# Patient Record
Sex: Female | Born: 1940 | Race: White | Hispanic: No | Smoking: Former smoker
Health system: Southern US, Community
[De-identification: ages and names within clinical notes are randomized; demographics above are authoritative.]

## PROBLEM LIST (undated history)

## (undated) DIAGNOSIS — I10 Essential (primary) hypertension: Secondary | ICD-10-CM

## (undated) HISTORY — DX: Essential (primary) hypertension: I10

## (undated) HISTORY — PX: CHOLECYSTECTOMY: SHX55

---

## 1998-10-09 ENCOUNTER — Encounter: Payer: Self-pay | Admitting: Family Medicine

## 1998-10-09 ENCOUNTER — Ambulatory Visit (HOSPITAL_COMMUNITY): Admission: RE | Admit: 1998-10-09 | Discharge: 1998-10-09 | Payer: Self-pay | Admitting: Family Medicine

## 1999-01-11 ENCOUNTER — Other Ambulatory Visit: Admission: RE | Admit: 1999-01-11 | Discharge: 1999-01-11 | Payer: Self-pay | Admitting: Family Medicine

## 1999-02-13 ENCOUNTER — Ambulatory Visit (HOSPITAL_COMMUNITY): Admission: RE | Admit: 1999-02-13 | Discharge: 1999-02-13 | Payer: Self-pay | Admitting: Gastroenterology

## 2001-04-02 ENCOUNTER — Other Ambulatory Visit: Admission: RE | Admit: 2001-04-02 | Discharge: 2001-04-02 | Payer: Self-pay | Admitting: Family Medicine

## 2002-02-22 ENCOUNTER — Encounter (INDEPENDENT_AMBULATORY_CARE_PROVIDER_SITE_OTHER): Payer: Self-pay | Admitting: *Deleted

## 2002-02-22 ENCOUNTER — Inpatient Hospital Stay (HOSPITAL_COMMUNITY): Admission: EM | Admit: 2002-02-22 | Discharge: 2002-02-24 | Payer: Self-pay | Admitting: Emergency Medicine

## 2002-02-22 ENCOUNTER — Encounter: Payer: Self-pay | Admitting: General Surgery

## 2002-02-23 ENCOUNTER — Encounter: Payer: Self-pay | Admitting: General Surgery

## 2002-04-05 ENCOUNTER — Other Ambulatory Visit: Admission: RE | Admit: 2002-04-05 | Discharge: 2002-04-05 | Payer: Self-pay | Admitting: Family Medicine

## 2004-01-11 ENCOUNTER — Encounter: Admission: RE | Admit: 2004-01-11 | Discharge: 2004-01-11 | Payer: Self-pay | Admitting: Family Medicine

## 2004-04-05 ENCOUNTER — Other Ambulatory Visit: Admission: RE | Admit: 2004-04-05 | Discharge: 2004-04-05 | Payer: Self-pay | Admitting: Family Medicine

## 2005-01-04 ENCOUNTER — Encounter: Admission: RE | Admit: 2005-01-04 | Discharge: 2005-01-04 | Payer: Self-pay | Admitting: Family Medicine

## 2005-03-13 ENCOUNTER — Encounter: Admission: RE | Admit: 2005-03-13 | Discharge: 2005-03-13 | Payer: Self-pay | Admitting: Family Medicine

## 2005-04-12 ENCOUNTER — Encounter: Admission: RE | Admit: 2005-04-12 | Discharge: 2005-04-12 | Payer: Self-pay | Admitting: Family Medicine

## 2007-06-02 IMAGING — CR DG LUMBAR SPINE COMPLETE 4+V
5 series · 5 of 5 positions shown · non-contrast
Comparison: none

CLINICAL DATA: Low back pain.  
LUMBAR SPINE ? 5 VIEW:  
Minimal lumbar scoliosis convex to the left. DDD changes mainly at L2-3 and L3-4, on the right.  Degenerative changes lower lumbar facet joints.  Diffuse osteopenia. Negative for acute compression fractures.   SI joints unremarkable.  L5 represents transitional vertebrae in that it has prominent transverse processes, the right articulating with the sacral ala.

[t l-spine a.p.]
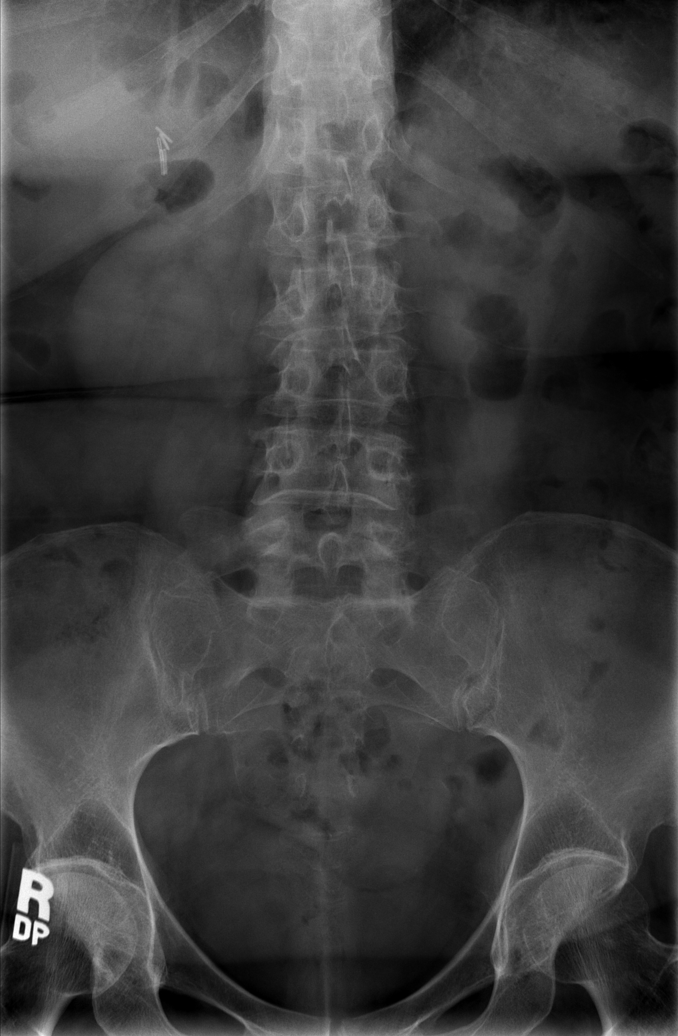

[t l-spine oblique exposure (1 of 2)]
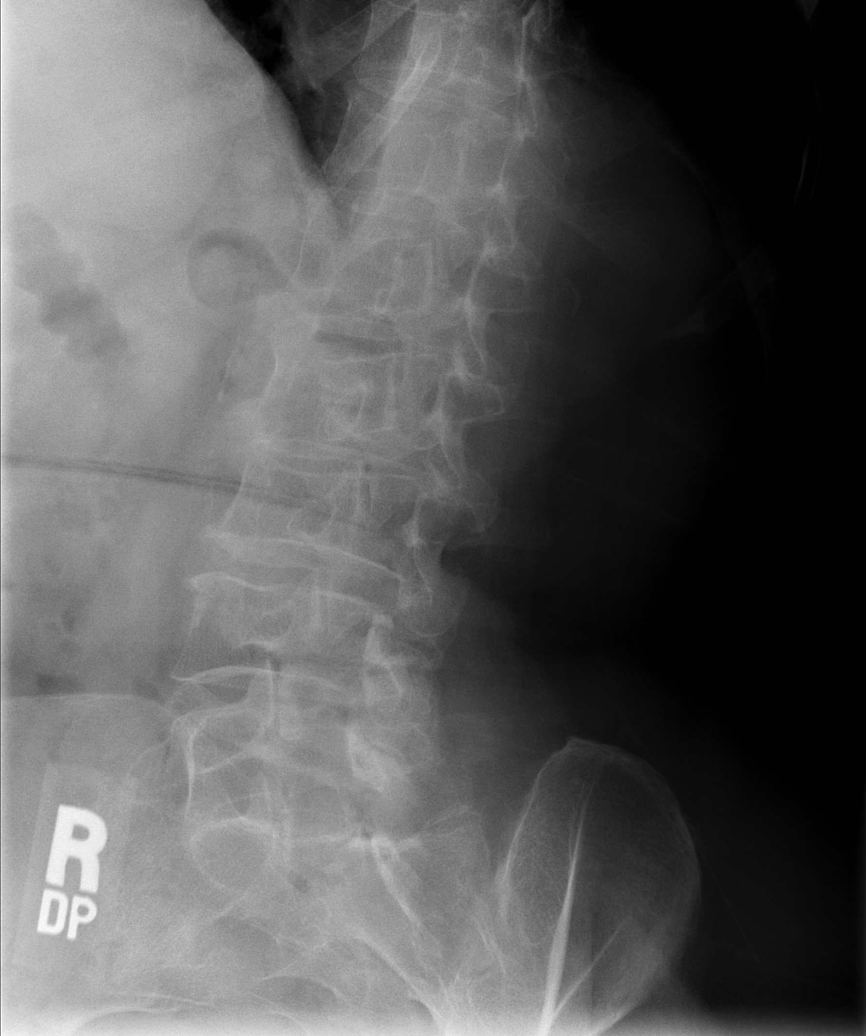

[t l-spine oblique exposure (2 of 2)]
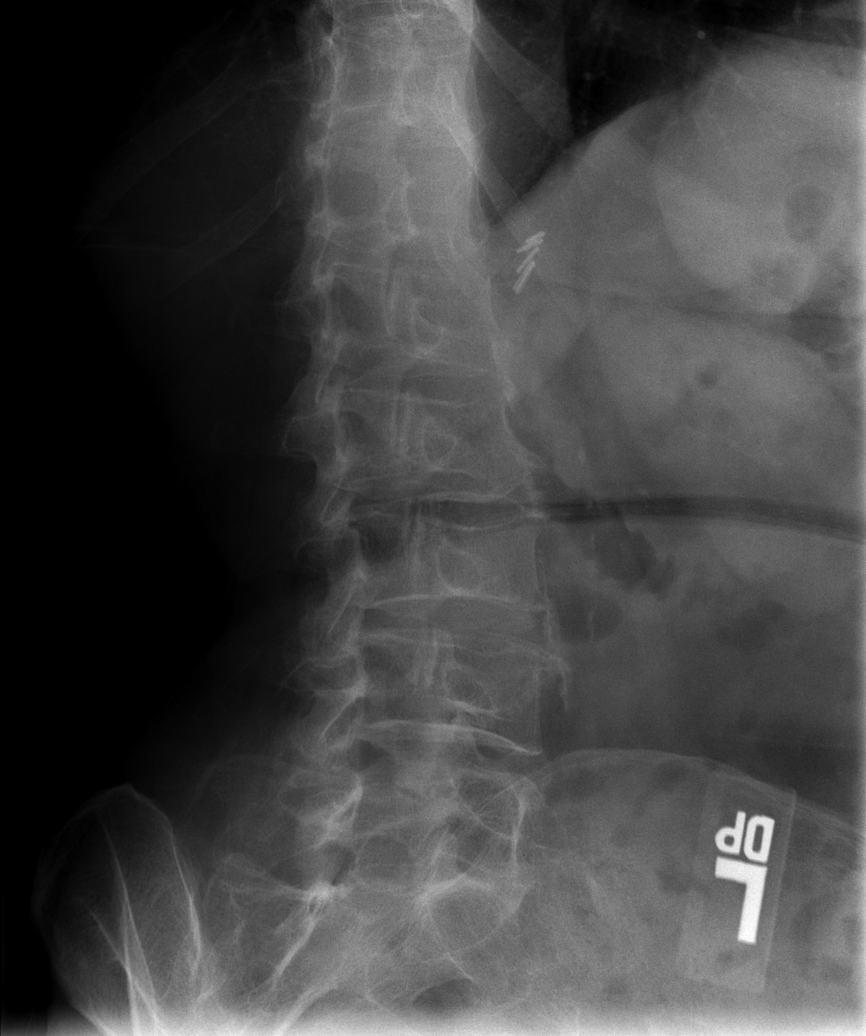

[t l-spine lat]
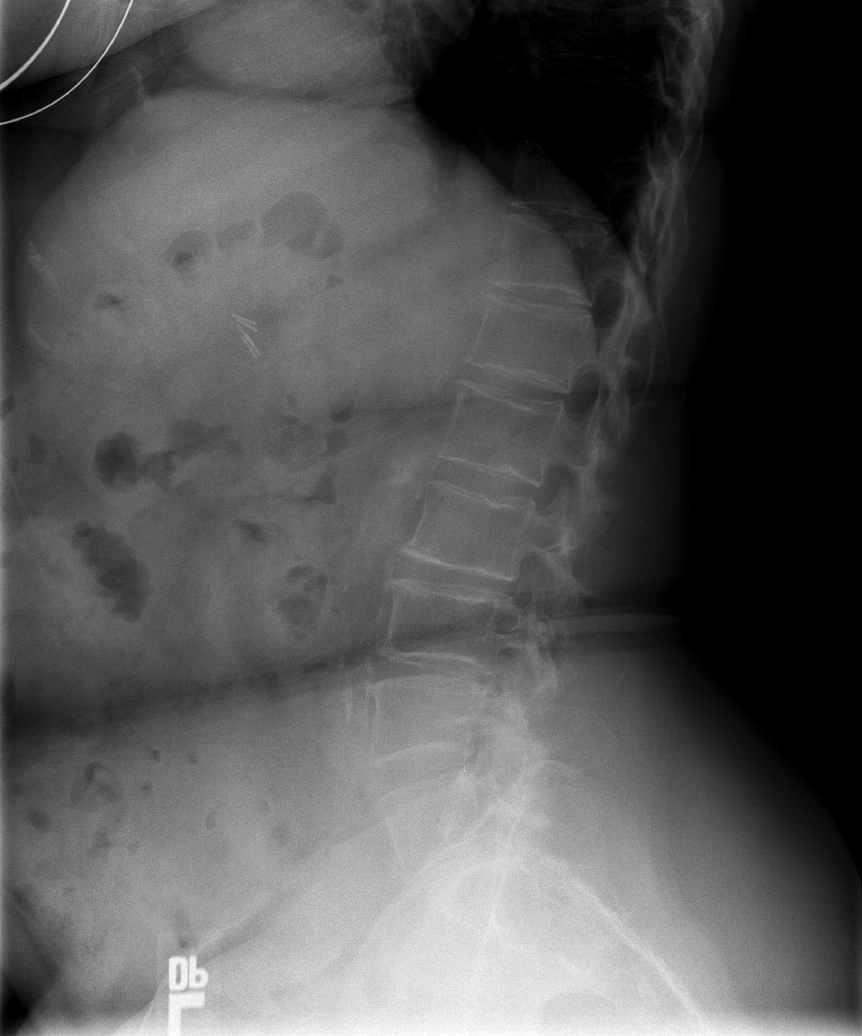

[t l-spine l5-s1 spot]
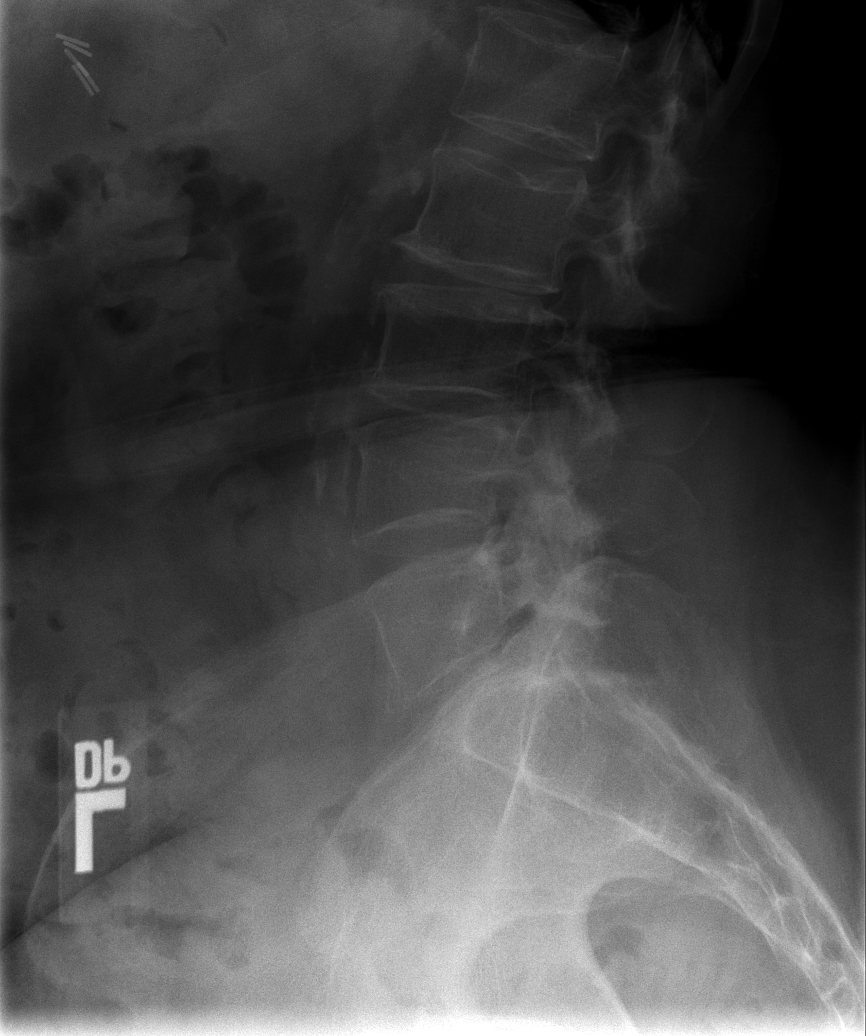

[5 of 5 positions shown; findings below may reference images not displayed]

IMPRESSION: Degenerative spondylotic changes.  Diffuse osteopenia.  See comments above.

## 2007-12-04 ENCOUNTER — Encounter: Admission: RE | Admit: 2007-12-04 | Discharge: 2007-12-04 | Payer: Self-pay | Admitting: Family Medicine

## 2008-01-06 ENCOUNTER — Encounter: Admission: RE | Admit: 2008-01-06 | Discharge: 2008-01-06 | Payer: Self-pay | Admitting: Family Medicine

## 2008-10-18 ENCOUNTER — Encounter: Admission: RE | Admit: 2008-10-18 | Discharge: 2008-10-18 | Payer: Self-pay | Admitting: Family Medicine

## 2010-10-26 NOTE — Op Note (Signed)
NAME:  Heather Roth, Heather Roth NO.:  [id number]   MEDICAL RECORD NO.:  [id number]                  PATIENT TYPE:   LOCATION:                                       FACILITY:   PHYSICIAN:  Sharlet Salina T. Hoxworth, M.D.          DATE OF BIRTH:   DATE OF PROCEDURE:  02/23/2002  DATE OF DISCHARGE:                                 OPERATIVE REPORT   PREOPERATIVE DIAGNOSIS:  Cholelithiasis and cholecystitis.   PREOPERATIVE DIAGNOSIS:  Cholelithiasis and cholecystitis.   SURGICAL PROCEDURE:  Laparoscopic cholecystectomy with intraoperative  cholangiogram.   SURGEON:  Sharlet Salina T. Hoxworth, M.D.   ASSISTANT:  Gabrielle Dare. Janee Morn, M.D.   ANESTHESIA:  General.   HISTORY:  The patient is a 69 year-old white female with known gallstones  after an episode of abdominal pain last fall.  She declined cholecystectomy  at that time. She now presents with 24 hours of persistent right upper  quadrant abdominal pain and has a mildly elevated white count and tenderness  on exam.  LFT's are mildly elevated with an SGPT of 90 and an alkaline  phosphatase of 227.  She is felt to have acute cholecystitis and  laparoscopic cholecystectomy with cholangiogram has been recommended.  The  nature of the procedure and its indications and risks of bleeding,  infection, bile leak and bile duct injury and anesthetic complications were  discussed with the patient and her family preoperatively.  She is now  brought to the operating room for this procedure.   DESCRIPTION OF PROCEDURE:  The patient was brought to the operating room,  placed in the supine position on the operating table and general  endotracheal anesthesia was induced.  She had received preoperative  antibiotics.  The abdomen was sterilely prepped and draped.  Local  anesthesia was used to infiltrate the trocar sites prior to the incisions.  A 1 cm incision was made at the umbilicus and dissection carried down to the  midline  fascia which was sharply incised from 1 cm and the peritoneum  entered under direct vision.  Through a mattress suture of 0 Vicryl the  Hasson trocar was placed and pneumoperitoneum established. Under direct  vision a 10 mm trocar was placed in the subxiphoid area and two 5 mm trocars  along the right subcostal margin.  The omentum was found to have some  adhesions up around the falciform ligament and diaphragm.  These were taken  down with cautery scissors allowing exposure of the gallbladder that was  quite edematous.  The fundus was grasped and elevated up over the liver and  the infundibulum retracted inferolaterally.  Fiber and fatty tissue were  stripped off the neck of gallbladder toward the porta hepatis.  The tissue  was edematous and dissected easily.  The distal gallbladder and cystic duct  were dissected and the cystic duct gallbladder junction identified.  The  cystic artery was identified in  close triangle which was thoroughly  dissected.  After the anatomy was clear the cystic artery was doubly clipped  proximally and clipped distally and the cystic duct was hooked to the  gallbladder junction.  Operative cholangiograms were then obtained through  the cystic duct which showed good filling of a normal size common bile duct  and intrahepatic duct with free flow into the duodenum and no filling  defects.  Following this the cholangiogram catheter was removed and the  cystic duct doubly clipped proximally.  The cystic artery and duct were  divided. The gallbladder was dissected free from its bed using hook cautery  and removed through the umbilicus.  The right upper quadrant was thoroughly  irrigated and complete hemostasis assured at the operative site.  The  trocars were removed under direct vision. All  C02 was evacuated from the peritoneal cavity.  The mattress suture was  secured to the umbilicus.  The skin incisions were closed with interrupted  figure of four 0 Monocryl  and Steri-Strips.  Sponge, needle and instrument  counts were correct.  Dry sterile dressings were applied and the patient was  taken to the recovery room in good condition.                                                Lorne Skeens. Hoxworth, M.D.    Tory Emerald  D:  02/23/2002  T:  02/23/2002  Job:  [id number]

## 2011-12-27 ENCOUNTER — Encounter: Payer: Self-pay | Admitting: Gynecology

## 2011-12-27 ENCOUNTER — Ambulatory Visit (INDEPENDENT_AMBULATORY_CARE_PROVIDER_SITE_OTHER): Payer: Medicare Other | Admitting: Gynecology

## 2011-12-27 VITALS — BP 124/80 | Ht 64.0 in | Wt 200.0 lb

## 2011-12-27 DIAGNOSIS — D259 Leiomyoma of uterus, unspecified: Secondary | ICD-10-CM

## 2011-12-27 DIAGNOSIS — N952 Postmenopausal atrophic vaginitis: Secondary | ICD-10-CM

## 2011-12-27 DIAGNOSIS — I10 Essential (primary) hypertension: Secondary | ICD-10-CM | POA: Insufficient documentation

## 2011-12-27 DIAGNOSIS — R9389 Abnormal findings on diagnostic imaging of other specified body structures: Secondary | ICD-10-CM

## 2011-12-27 NOTE — Patient Instructions (Signed)
Follow up for ultrasound as scheduled 

## 2011-12-27 NOTE — Progress Notes (Signed)
Patient presents in consultation from Dr. Antony Haste.  She was seen for abdominal pain and had an abdominopelvic ultrasound performed. The pelvic ultrasound reports: "Uterus 4.9 x 5.6 x 8.1 cm in size. A 3.4 x 3.0 x 3.6 size anterior fibroid is noted. The endometrium is thin however a small amount of fluid is present in the endometrial canal, an abnormal postmenopausal finding. No cul-de-sac fluid. Neither ovary is confidently identified. The adnexa regions are largely bowel gas obscured".  Patient is not having any GYN complaints. She has no bleeding, discharge or history of any gynecologic issues in the past. Has never been told she had fibroids. Has had negative Pap smears. Status post 3 vaginal deliveries. She receives her routine care to her primary physician who performs her breast/pelvic exams.  She reports that the abdominal pain she was having previously has resolved.  Exam was can assist Spine straight without deformity or CVA tenderness. Abdomen soft nontender without masses guarding rebound organomegaly. Pelvic external BUS vagina with atrophic changes. Cervix grossly normal. Uterus grossly normal midline mobile nontender. Adnexa without masses or tenderness. Rectovaginal exam is normal.  Assessment and plan: Abdominal pain, now resolved. I do not feel this was a GYN etiology. I think the small myoma and fluid as described in the endometrial cavity are incidental findings. I reviewed the CD images from her ultrasound and was unable to confidently evaluate the fluid/endometrial thickness pictures. I recommended to the patient will repeat the GYN ultrasound here with possible sonohysterogram.  If any question of endometrial findings, I will perform the sonohysterogram with biopsy. If endometrial echo is thin with small amount of fluid then we'll not biopsy as I feel this would be a normal finding given the absence of bleeding or other symptoms. Patient agrees with the plan and will follow up  for the ultrasound.

## 2011-12-30 ENCOUNTER — Telehealth: Payer: Self-pay | Admitting: *Deleted

## 2011-12-30 NOTE — Telephone Encounter (Signed)
Message copied by Aura Camps on Mon Dec 30, 2011  3:29 PM ------      Message from: Carole Civil R      Created: Mon Dec 30, 2011  2:17 PM      Contact: [phone]       Redgie Grayer, This pt has [employer] which requires we call for benefits/coverage. TF scheduled her for a sonohystogram on 01/09/12. Her insurance will not guarantee payment as it now uses regular Medicare guidelines that no precert is given and bills are paid based on medial necessity. She asked to check with TF to see if there are any other options for her.      The voicemail system was busy so I had to direct this to you myself. The pt phone number is above for you to get back to her.      Thank you, Toniann Fail

## 2011-12-30 NOTE — Telephone Encounter (Signed)
Spoke with pt regarding the below note, pt said her insurance company told her that they would pay 80% and she would be responsible of the 20%. Per wendy the insurance is not guaranteed to pay for the [facility] 100%. Pt said she will call to speak with insurance company.

## 2012-01-09 ENCOUNTER — Other Ambulatory Visit: Payer: Self-pay | Admitting: Gynecology

## 2012-01-09 ENCOUNTER — Encounter: Payer: Self-pay | Admitting: Gynecology

## 2012-01-09 ENCOUNTER — Ambulatory Visit (INDEPENDENT_AMBULATORY_CARE_PROVIDER_SITE_OTHER): Payer: Medicare Other | Admitting: Gynecology

## 2012-01-09 ENCOUNTER — Ambulatory Visit (INDEPENDENT_AMBULATORY_CARE_PROVIDER_SITE_OTHER): Payer: Medicare Other

## 2012-01-09 DIAGNOSIS — R9389 Abnormal findings on diagnostic imaging of other specified body structures: Secondary | ICD-10-CM

## 2012-01-09 DIAGNOSIS — N950 Postmenopausal bleeding: Secondary | ICD-10-CM

## 2012-01-09 DIAGNOSIS — D259 Leiomyoma of uterus, unspecified: Secondary | ICD-10-CM

## 2012-01-09 DIAGNOSIS — R1031 Right lower quadrant pain: Secondary | ICD-10-CM

## 2012-01-09 DIAGNOSIS — N882 Stricture and stenosis of cervix uteri: Secondary | ICD-10-CM

## 2012-01-09 NOTE — Progress Notes (Signed)
Patient presents for sonohysterogram due to recent pelvic ultrasound elsewhere showing fluid in the endometrial canal which they stated was an abnormal finding.  She is having no bleeding or other GYN issues. It was done due to abdominal discomfort.  Ultrasound today shows normal sized uterus with anterior leiomyoma measuring 40 x 36 crit 38 mm. Ovaries were not identified. Adnexa were negative. Endometrial echoes 6.3 mm. No free fluid in the cul-de-sac. Cervix was cleansed with Betadine. Paracervical block using 1% lidocaine total of 6 cc was placed. A single-tooth tenaculum and treatment stabilization and cervix was gently dilated to admit the sonohysterogram catheter. Good distention no abnormalities seen. Endometrial sample taken. Dark fluid returning sent to pathology.  Assessment and plan: Thicker endometrial echo as an incidental finding. Sonohysterogram shows small myoma. Evidence of old blood in the cavity on endometrial sample. Assuming sample negative or inadequate plan expected management with any follow up if bleeding or other issues in the future.

## 2012-01-09 NOTE — Patient Instructions (Signed)
Office will call you with the biopsy results 

## 2012-12-28 ENCOUNTER — Encounter: Payer: Self-pay | Admitting: Gynecology

## 2014-04-11 ENCOUNTER — Encounter: Payer: Self-pay | Admitting: Gynecology

## 2014-12-20 ENCOUNTER — Ambulatory Visit: Payer: Medicare Other | Attending: Family Medicine | Admitting: Physical Therapy

## 2014-12-20 DIAGNOSIS — M2560 Stiffness of unspecified joint, not elsewhere classified: Secondary | ICD-10-CM | POA: Insufficient documentation

## 2014-12-20 DIAGNOSIS — M5386 Other specified dorsopathies, lumbar region: Secondary | ICD-10-CM

## 2014-12-20 DIAGNOSIS — R29898 Other symptoms and signs involving the musculoskeletal system: Secondary | ICD-10-CM | POA: Insufficient documentation

## 2014-12-20 DIAGNOSIS — M25669 Stiffness of unspecified knee, not elsewhere classified: Secondary | ICD-10-CM

## 2014-12-20 DIAGNOSIS — M545 Low back pain, unspecified: Secondary | ICD-10-CM

## 2014-12-20 DIAGNOSIS — Z9181 History of falling: Secondary | ICD-10-CM | POA: Diagnosis present

## 2014-12-20 DIAGNOSIS — M25551 Pain in right hip: Secondary | ICD-10-CM | POA: Insufficient documentation

## 2014-12-20 NOTE — Patient Instructions (Addendum)
   Willadene Mounsey PT, DPT, LAT, ATC  [facility] Phone: [phone]     

## 2014-12-20 NOTE — Therapy (Signed)
[place], [place], [address] Phone: [phone]   Fax:  [phone]  Physical Therapy Evaluation  Patient Details  Name: Heather Roth MRN: [id number] Date of Birth: Apr 18, 1941 Referring Provider:  Chesley Noon, MD  Encounter Date: 12/20/2014      PT End of Session - 12/20/14 1203    Visit Number 1   Number of Visits 8   Date for PT Re-Evaluation 02/14/15   PT Start Time 1219   PT Stop Time 1102   PT Time Calculation (min) 47 min   Activity Tolerance Patient tolerated treatment well   Behavior During Therapy [facility] for tasks assessed/performed      Past Medical History  Diagnosis Date  . Hypertension     Past Surgical History  Procedure Laterality Date  . Cholecystectomy      There were no vitals filed for this visit.  Visit Diagnosis:  Midline low back pain without sciatica - Plan: PT plan of care cert/re-cert  Right hip pain - Plan: PT plan of care cert/re-cert  Decreased ROM of lumbar spine - Plan: PT plan of care cert/re-cert  Weakness of both legs - Plan: PT plan of care cert/re-cert  Risk for falls - Plan: PT plan of care cert/re-cert  Decreased ROM of lower extremity - Plan: PT plan of care cert/re-cert      Subjective Assessment - 12/20/14 1012    Subjective pt is a 74 y.o F CC of multiple joint osteoarthritis. she reports the greatness discomfort in the low back and R hip. she reports the pain has been going for about 5 years. she reported the hip and back started insidiously. since onset she reports the pain has gradually gotten worse in the low back and fluctuates in the hip.  She reports getting a cortizone injection  around the 15th of June in the hip, which she reported helped the hip and back.   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 1-2 hours   How long can you stand comfortably? 30- 60 min   How long can you walk comfortably? 30 min   Diagnostic tests can't remeber the date, x-ray per pt report osteoarthitis in back and hip   Patient Stated Goals to increase mobility, to be pain free, increase strength in legs   Currently in Pain? Yes   Pain Score 5   took aleve 6:30   Pain Location Back   Pain Orientation Lower;Mid   Pain Descriptors / Indicators Aching;Sore;Sharp  "just really hurts"   Pain Type Chronic pain   Pain Onset More than a month ago   Pain Frequency Constant   Aggravating Factors  walking up stairs/ hill, bending forward, lifting    Pain Relieving Factors aleve, epsom salt bath, ice, heat   Multiple Pain Sites Yes   Pain Score 0  this morning 3/10   Pain Location Hip   Pain Orientation Right   Pain Descriptors / Indicators Aching;Sore   Pain Type Chronic pain   Pain Onset More than a month ago   Pain Frequency Intermittent   Aggravating Factors  stairs, walking up/down hill. prolonged standing/walking   Pain Relieving Factors sitting, laying, epsom salt, aleve            [facility] PT Assessment - 12/20/14 1025    Assessment   Medical Diagnosis multiple joint osteoarthritis  low  back and R hip pain   Onset Date/Surgical Date --  since 2011   Hand Dominance Right   Next MD Visit --  6 weeks from last visit   Prior Therapy yes   Precautions   Precautions None   Restrictions   Weight Bearing Restrictions No   Balance Screen   Has the patient fallen in the past 6 months No   Has the patient had a decrease in activity level because of a fear of falling?  No   Is the patient reluctant to leave their home because of a fear of falling?  No   Home Environment   Living Environment Private residence   Living Arrangements Alone   Available Help at Discharge Available PRN/intermittently   Type of [place] to enter   Entrance Stairs-Number of Steps 2   Entrance Stairs-Rails Can  reach both   [place] One level   [place] None   Prior Function   Level of Independence Independent;Independent with basic ADLs   Vocation Retired   Leisure to be with family, go to church,    Observation/Other Assessments   The [place] [employer] Osteoarthritis Index [facility])  48.96   Posture/Postural Control   Posture/Postural Control Postural limitations   Postural Limitations Rounded Shoulders;Forward head;Increased lumbar lordosis   ROM / Strength   AROM / PROM / Strength AROM;Strength   AROM   AROM Assessment Site Lumbar;Hip   Right/Left Hip Right;Left   Right Hip Extension 10   Right Hip Flexion 80   Right Hip ABduction 20   Left Hip Extension 20   Left Hip Flexion 90   Lumbar Flexion 68   Lumbar Extension 18   Lumbar - Right Side Bend 28   Lumbar - Left Side Bend 28   Lumbar - Right Rotation 45%   Lumbar - Left Rotation 45%   Strength   Strength Assessment Site Hip   Right/Left Hip Right;Left   Right Hip Flexion 3+/5  pain during testing   Right Hip Extension 3+/5   Right Hip ABduction 3+/5   Right Hip ADduction 4/5   Left Hip Flexion 3+/5   Left Hip Extension 3+/5   Left Hip ABduction 3+/5   Left Hip ADduction 4/5   Palpation   Spinal mobility  hypomobile at L1-L5   Special Tests    Special Tests Lumbar;Hip Special Tests   Lumbar Tests Slump Test;Prone Knee Bend Test;Straight Leg Raise   Hip Special Tests  Hip Scouring;Thomas Test   Slump test   Findings Negative   Side --  bil   Prone Knee Bend Test   Findings Negative   Side --  bil   Straight Leg Raise   Findings Negative   Side  --  bil   Thomas Test    Findings Positive   Side --  bil   Hip Scouring   Findings Positive   Side Right   Standardized Balance Assessment   Standardized Balance Assessment Berg Balance Test;10 meter walk test                           PT Education - 12/20/14 1202    Education provided Yes   Education  Details evaluation findings, POC, goals, HEP, anatomical education   Person(s) Educated Patient   Methods Explanation   Comprehension Verbalized understanding          PT Short  Term Goals - 12/20/14 1345    PT SHORT TERM GOAL #1   Title pt will be I with basic HEP (01/20/2015)   Time 4   Period Weeks   Status New   PT SHORT TERM GOAL #2   Title pt increase her hip mobility by > 8 degrees with flexion and extension to assist with funcitonal mobility (01/20/2015)   Time 4   Period Weeks   Status New   PT SHORT TERM GOAL #3   Title pt will increase bil LE strength to >4-/5 to help promote safety with walking and decrease her fear of falling (01/20/2015)   Time 4   Period Weeks   Status New   PT SHORT TERM GOAL #4   Title pt will decrease her WOMAC by > 5 points to help with increased functional mobility (01/20/2015)   Time 4   Period Weeks   Status New   PT SHORT TERM GOAL #5   Title pt will be able to verbalize and demonstrate techniques to reduce low back and R hip pain via postural awareness, lifting and carrying mechanics and HEP (01/20/2015)   Time 4   Period Weeks   Status New           PT Long Term Goals - 12/20/14 1348    PT LONG TERM GOAL #1   Title upon discharge pt will be I with all HEP given throughout therapy (02/14/2015)   Time 8   Period Weeks   Status New   PT LONG TERM GOAL #2   Title pt will decrease pain to <2/10 during and following walking/standing >45 min, and ascending/descending > 10 steps to assist with funcitonal mobility (02/14/2015)   Time 8   Period Weeks   Status New   PT LONG TERM GOAL #3   Title She will increase bil le strenght to > 4/5 to help with walking/standing endurnace and safety (02/14/2015)   Time 8   Period Weeks   Status New   PT LONG TERM GOAL #4   Title She will decrease her 10MWT by > 6 seconds to help with funcitonal community amb (02/14/2015)   Time 8   Period Weeks   Status New   PT LONG TERM GOAL #5   Title pt will  decrease WOMAC score by > 10 points upon discharge to demonstrate improved functional capacity (02/14/2015)   Time [place]   Status New               Plan - 12/20/14 1338    Clinical Impression Statement Amylee presents to OPPT with CC of multiple joint osteoarthritis with the back and right hip being most symptomatic. Low back she demonstrates limited trunk mobility with  pain and tightness at end range, she is hypomobile at L1-L5 with pain upon palpation in bil paraspinals, and increased lumbar lordosis. R hip demonstates limited mobility due to pain and weakness compared bil, and weakness of bil LE rated at 3+/5. She scored a 48.96% on the [facility] indicating moderate osteoarthritis. She reports a fear of falling and walks with a limited step length and candence bil demonstrating a 17 sec 10 mwt which is below community ambulaiton. she would benefit from skiled physical therapy to maximize her funciton by addressing the impairments listed.     Pt will benefit from skilled therapeutic intervention in order to improve on the following deficits Abnormal gait;Decreased activity tolerance;Decreased endurance;Decreased balance;Difficulty walking;Decreased strength;Decreased range of motion;Hypomobility;Pain;Improper body  mechanics;Postural dysfunction;Increased muscle spasms   Rehab Potential Good   PT Frequency 1x / week  due to financial means pt opted for 1 x a week   PT Duration 8 weeks   PT Treatment/Interventions ADLs/Self Care Home Management;Cryotherapy;Electrical Stimulation;Moist Heat;Ultrasound;Gait training;Stair training;Functional mobility training;Therapeutic activities;Therapeutic exercise;Balance training;Neuromuscular re-education;Patient/family education;Manual techniques;Dry needling;Passive range of motion;Taping   PT Next Visit Plan assess response to HEP, bil LE strengthening, R hip mobilization, posture education   PT Home Exercise Plan see HEP handout   Consulted and  Agree with Plan of Care Patient          G-Codes - 2014/12/28 1354    Functional Assessment Tool Used WOMAC 48.96   Functional Limitation Mobility: Walking and moving around   Mobility: Walking and Moving Around Current Status [phone]) At least 40 percent but less than 60 percent impaired, limited or restricted   Mobility: Walking and Moving Around Goal Status [phone]) At least 20 percent but less than 40 percent impaired, limited or restricted       Problem List Patient Active Problem List   Diagnosis Date Noted  . Hypertension    [place] PT, DPT, LAT, ATC  28-Dec-2014  1:57 PM    [facility] [address] [place], [place], [address] Phone: [phone]   Fax:  [phone]

## 2015-01-03 ENCOUNTER — Ambulatory Visit: Payer: Medicare Other | Admitting: Physical Therapy

## 2015-01-03 DIAGNOSIS — M545 Low back pain, unspecified: Secondary | ICD-10-CM

## 2015-01-03 DIAGNOSIS — M25669 Stiffness of unspecified knee, not elsewhere classified: Secondary | ICD-10-CM

## 2015-01-03 DIAGNOSIS — M5386 Other specified dorsopathies, lumbar region: Secondary | ICD-10-CM

## 2015-01-03 DIAGNOSIS — R29898 Other symptoms and signs involving the musculoskeletal system: Secondary | ICD-10-CM

## 2015-01-03 DIAGNOSIS — M25551 Pain in right hip: Secondary | ICD-10-CM

## 2015-01-03 DIAGNOSIS — Z9181 History of falling: Secondary | ICD-10-CM

## 2015-01-03 NOTE — Therapy (Signed)
[place], [place], [address] Phone: [phone]   Fax:  [phone]  Physical Therapy Treatment  Patient Details  Name: Heather Roth MRN: [id number] Date of Birth: 1941-01-26 Referring Provider:  Chesley Noon, MD  Encounter Date: 01/03/2015      PT End of Session - 01/03/15 0914    Visit Number 2   Number of Visits 8   Date for PT Re-Evaluation 02/14/15   PT Start Time 0845   PT Stop Time 0930   PT Time Calculation (min) 45 min   Activity Tolerance Patient tolerated treatment well   Behavior During Therapy [employer] for tasks assessed/performed      Past Medical History  Diagnosis Date  . Hypertension     Past Surgical History  Procedure Laterality Date  . Cholecystectomy      There were no vitals filed for this visit.  Visit Diagnosis:  Midline low back pain without sciatica  Right hip pain  Decreased ROM of lumbar spine  Weakness of both legs  Risk for falls  Decreased ROM of lower extremity      Subjective Assessment - 01/03/15 0854    Subjective "today I am not in that much pain, it just takes me so long to get around" pt reports wanting faster mobility but is afraid of faster mobility.    Currently in Pain? Yes   Pain Score 6   took aleve at 7am   Pain Location Back   Pain Orientation Lower;Mid   Pain Descriptors / Indicators Sharp   Pain Type Chronic pain   Pain Onset More than a month ago   Pain Frequency Constant   Aggravating Factors  walking up stairs/ hill, bending forward, lifting   Pain Relieving Factors aleve, epsom alt bath, ice heat                         [facility] Adult PT Treatment/Exercise - 01/03/15 0858    Static Standing Balance   Tandem Stance - Right Leg 30  x 1 set   Tandem Stance - Left Leg 30  modified x 1 set   Rhomberg - Eyes Opened 30  x 1 set   Rhomberg - Eyes Closed 30  x 2 sets   Self-Care   Self-Care ADL's   ADL's  taking time to do ADLs and avoiding hurrying and making possible mistakes to reduce chance of fallling or getting tripped up.    Lumbar Exercises: Stretches   Passive Hamstring Stretch 2 reps;30 seconds   Lower Trunk Rotation 3 reps;20 seconds   Pelvic Tilt 3 reps;10 seconds  VC and tactile cueing to posteriorly tilt pelvis   Lumbar Exercises: Aerobic   Stationary Bike NuStep L5 x 5 min  with both arms and legs   Lumbar Exercises: Standing   Other Standing Lumbar Exercises step ups 2 x 12 with 4in step   Lumbar Exercises: Seated   Long Arc Quad on Chair AROM;Strengthening;Both;2 sets;10 reps  4#, VC for slow and controlled movement   Sit to Stand 15 reps  VC to avoid using the back of the knees of tbale                PT Education - 01/03/15 1114    Education provided Yes   Education Details HEP Review    Person(s) Educated Patient   Methods Explanation   Comprehension Verbalized understanding;Returned demonstration  PT Short Term Goals - 12/20/14 1345    PT SHORT TERM GOAL #1   Title pt will be I with basic HEP (01/20/2015)   Time 4   Period Weeks   Status New   PT SHORT TERM GOAL #2   Title pt increase her hip mobility by > 8 degrees with flexion and extension to assist with funcitonal mobility (01/20/2015)   Time 4   Period Weeks   Status New   PT SHORT TERM GOAL #3   Title pt will increase bil LE strength to >4-/5 to help promote safety with walking and decrease her fear of falling (01/20/2015)   Time 4   Period Weeks   Status New   PT SHORT TERM GOAL #4   Title pt will decrease her WOMAC by > 5 points to help with increased functional mobility (01/20/2015)   Time 4   Period Weeks   Status New   PT SHORT TERM GOAL #5   Title pt will be able to verbalize and demonstrate techniques to reduce low back and R hip pain via postural awareness, lifting and carrying mechanics and HEP (01/20/2015)   Time 4   Period Weeks   Status New           PT  Long Term Goals - 12/20/14 1348    PT LONG TERM GOAL #1   Title upon discharge pt will be I with all HEP given throughout therapy (02/14/2015)   Time 8   Period Weeks   Status New   PT LONG TERM GOAL #2   Title pt will decrease pain to <2/10 during and following walking/standing >45 min, and ascending/descending > 10 steps to assist with funcitonal mobility (02/14/2015)   Time 8   Period Weeks   Status New   PT LONG TERM GOAL #3   Title She will increase bil le strenght to > 4/5 to help with walking/standing endurnace and safety (02/14/2015)   Time 8   Period Weeks   Status New   PT LONG TERM GOAL #4   Title She will decrease her 10MWT by > 6 seconds to help with funcitonal community amb (02/14/2015)   Time 8   Period Weeks   Status New   PT LONG TERM GOAL #5   Title pt will decrease WOMAC score by > 10 points upon discharge to demonstrate improved functional capacity (02/14/2015)   Time 8   Period Weeks   Status New               Plan - 01/03/15 1114    Clinical Impression Statement Heather Roth states one of her HEP exercises caused her discomfort. after having her demonstrate it she was attempting a stretch that wasn't given in her HEP. Educated about proper form while doing exercises and performing pelvic tilts. work on Marketing executive and modified tandem with eyes open/ closed she demonstrated mild postural sway but was able to maintain her balance. added modified tandem balance in corner with chair for safety to her HEP    PT Next Visit Plan bil LE strengthening, R hip mobilization, posture education, balance training,    PT Home Exercise Plan modified tandem balance in corner with chai   Consulted and Agree with Plan of Care Patient        Problem List Patient Active Problem List   Diagnosis Date Noted  . Hypertension    Heather Roth PT, DPT, LAT, ATC  01/03/2015  11:20 AM  [place], [place], [address] Phone: [phone]   Fax:  [phone]

## 2015-01-10 ENCOUNTER — Ambulatory Visit: Payer: Medicare Other | Admitting: Physical Therapy

## 2015-01-16 ENCOUNTER — Ambulatory Visit: Payer: Medicare Other | Attending: Family Medicine | Admitting: Physical Therapy

## 2015-01-16 DIAGNOSIS — M25669 Stiffness of unspecified knee, not elsewhere classified: Secondary | ICD-10-CM

## 2015-01-16 DIAGNOSIS — R29898 Other symptoms and signs involving the musculoskeletal system: Secondary | ICD-10-CM | POA: Insufficient documentation

## 2015-01-16 DIAGNOSIS — Z9181 History of falling: Secondary | ICD-10-CM | POA: Diagnosis present

## 2015-01-16 DIAGNOSIS — M5386 Other specified dorsopathies, lumbar region: Secondary | ICD-10-CM

## 2015-01-16 DIAGNOSIS — M2560 Stiffness of unspecified joint, not elsewhere classified: Secondary | ICD-10-CM | POA: Insufficient documentation

## 2015-01-16 DIAGNOSIS — M25551 Pain in right hip: Secondary | ICD-10-CM

## 2015-01-16 NOTE — Patient Instructions (Signed)

## 2015-01-16 NOTE — Therapy (Addendum)
[place], [place], [address] Phone: [phone]   Fax:  [phone]  Physical Therapy Treatment  Patient Details  Name: Heather Roth MRN: [id number] Date of Birth: 1940-12-03 Referring Provider:  Chesley Noon, MD  Encounter Date: 01/16/2015      PT End of Session - 01/16/15 1828    Visit Number 3   Number of Visits 8   Date for PT Re-Evaluation 02/14/15   PT Start Time 0939   PT Stop Time 1022   PT Time Calculation (min) 43 min   Activity Tolerance Patient tolerated treatment well;Patient limited by pain   Behavior During Therapy [facility] for tasks assessed/performed      Past Medical History  Diagnosis Date  . Hypertension     Past Surgical History  Procedure Laterality Date  . Cholecystectomy      There were no vitals filed for this visit.  Visit Diagnosis:  Right hip pain  Decreased ROM of lumbar spine  Weakness of both legs  Risk for falls  Decreased ROM of lower extremity      Subjective Assessment - 01/16/15 0946    Subjective Had a injection in hip .  It helped a lot with hip and back pain.  Can stand up straight and wash dishes.     Currently in Pain? Yes   Pain Score 5    Pain Location Back   Pain Orientation Lower;Right;Mid   Pain Descriptors / Indicators Sharp   Pain Radiating Towards Rt hip   Aggravating Factors  walking unsupported. washing dishes , lifting heavy                         [facility] Adult PT Treatment/Exercise - 01/16/15 0950    Self-Care   Self-Care ADL's   ADL's Posture handoud briefly reviewed   Therapeutic Activites    Therapeutic Activities --  floor transfer 2 X with instruction using mat   Lumbar Exercises: Stretches   Passive Hamstring Stretch 3 reps;30 seconds   Lower Trunk Rotation 3 reps;10 seconds   Pelvic Tilt 5 reps;10 seconds   Lumbar Exercises: Aerobic   Stationary Bike Nustep 6 minutes L6  both arms and legs   Knee/Hip Exercises: Standing   Heel Raises Right;Left;1 set;10 reps   Side Lunges Limitations for stretches 3 reps 10-15 second holds   Forward Step Up Both;1 set;10 reps;Hand Hold: 2   Wall Squat Limitations 1 rep too painful knees, even with modifications                PT Education - 01/16/15 1824    Education provided Yes   Education Details Posture ADL Handout   Person(s) Educated Patient   Methods Explanation;Demonstration;Handout   Comprehension Verbalized understanding          PT Short Term Goals - 12/20/14 1345    PT SHORT TERM GOAL #1   Title pt will be I with basic HEP (01/20/2015)   Time 4   Period Weeks   Status New   PT SHORT TERM GOAL #2   Title pt increase her hip mobility by > 8 degrees with flexion and extension to assist with funcitonal mobility (01/20/2015)   Time 4   Period Weeks   Status New   PT SHORT TERM GOAL #3   Title pt will increase bil LE strength to >4-/5 to help promote safety with walking and decrease her fear of falling (  01/20/2015)   Time 4   Period Weeks   Status New   PT SHORT TERM GOAL #4   Title pt will decrease her WOMAC by > 5 points to help with increased functional mobility (01/20/2015)   Time 4   Period Weeks   Status New   PT SHORT TERM GOAL #5   Title pt will be able to verbalize and demonstrate techniques to reduce low back and R hip pain via postural awareness, lifting and carrying mechanics and HEP (01/20/2015)   Time 4   Period Weeks   Status New           PT Long Term Goals - 12/20/14 1348    PT LONG TERM GOAL #1   Title upon discharge pt will be I with all HEP given throughout therapy (02/14/2015)   Time 8   Period Weeks   Status New   PT LONG TERM GOAL #2   Title pt will decrease pain to <2/10 during and following walking/standing >45 min, and ascending/descending > 10 steps to assist with funcitonal mobility (02/14/2015)   Time 8   Period Weeks   Status New   PT LONG TERM GOAL #3   Title She will  increase bil le strenght to > 4/5 to help with walking/standing endurnace and safety (02/14/2015)   Time 8   Period Weeks   Status New   PT LONG TERM GOAL #4   Title She will decrease her 10MWT by > 6 seconds to help with funcitonal community amb (02/14/2015)   Time 8   Period Weeks   Status New   PT LONG TERM GOAL #5   Title pt will decrease WOMAC score by > 10 points upon discharge to demonstrate improved functional capacity (02/14/2015)   Time 8   Period Weeks   Status New               Plan - 01/16/15 1829    Clinical Impression Statement She needs cues and contact for floor transfer, her request        Problem List Patient Active Problem List   Diagnosis Date Noted  . Hypertension     [place] Dulay 01/16/2015, 6:32 PM  [facility] [address] [place], [place], [address] Phone: [phone]   Fax:  [phone]     Melvenia Needles, PTA 01/16/2015 6:32 PM Phone: [phone] Fax: [phone]    PHYSICAL THERAPY DISCHARGE SUMMARY  Visits from Start of Care: 3  Current functional level related to goals / functional outcomes: See goals   Remaining deficits: Deficits based on last visit due to pt not returning. Intermittent hip and low back pain, limited mobil9ty, difficulty with transfers.    Education / Equipment: HEP  Plan: Patient agrees to discharge.  Patient goals were not met. Patient is being discharged due to not returning since the last visit.  ?????       Kristoffer Leamon PT, DPT, LAT, ATC  04/20/2015  8:09 AM

## 2015-01-23 ENCOUNTER — Ambulatory Visit: Payer: Medicare Other | Admitting: Physical Therapy

## 2015-04-20 ENCOUNTER — Telehealth: Payer: Self-pay | Admitting: Physical Therapy

## 2015-04-20 NOTE — Telephone Encounter (Signed)
Error opening telephone encounter

## 2018-04-16 ENCOUNTER — Ambulatory Visit: Payer: Medicare Other | Admitting: Cardiovascular Disease

## 2018-04-23 ENCOUNTER — Ambulatory Visit: Payer: Medicare Other | Admitting: Cardiovascular Disease

## 2018-04-23 ENCOUNTER — Encounter: Payer: Self-pay | Admitting: Cardiovascular Disease

## 2018-04-23 VITALS — BP 118/72 | HR 74 | Ht 65.5 in | Wt 196.8 lb

## 2018-04-23 DIAGNOSIS — G4710 Hypersomnia, unspecified: Secondary | ICD-10-CM

## 2018-04-23 DIAGNOSIS — I10 Essential (primary) hypertension: Secondary | ICD-10-CM | POA: Diagnosis not present

## 2018-04-23 DIAGNOSIS — R0602 Shortness of breath: Secondary | ICD-10-CM

## 2018-04-23 DIAGNOSIS — E669 Obesity, unspecified: Secondary | ICD-10-CM | POA: Diagnosis not present

## 2018-04-23 NOTE — Progress Notes (Signed)
Cardiology Consultation Note:    Date:  04/23/2018   ID:  Heather Roth, DOB 06-30-1940, MRN [id number]  PCP:  Chesley Noon, MD  Cardiologist:  No primary care provider on file.  Electrophysiologist:  None   Referring MD: Chesley Noon, MD   No chief complaint on file. Heather Roth is a 77 y.o. female who is being seen today for the evaluation of fatigue and dyspnea at the request of Chesley Noon, MD.   History of Present Illness:    Heather Roth is a 77 y.o. female with a hx of systemic hypertension presenting with complaints of persistent fatigue.  The patient's daughter believes that she is also short of breath, but Mrs. Neils insists that she is limited only by fatigue she reports that she avoids walking long distances due to a fear of falling.  She also has a variety of musculoskeletal complaints but she denies dyspnea with activity.  The limits of her activities walking to her mailbox, which is only roughly 40 feet of her front door.  Her symptoms have been going on for about a year and clearly began after the demise of her son from metastatic prostate cancer.  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  She remembers having to wake up every hour in order to administer his pain medication.  She had severely fragmented sleep and feels she has never recovered.  She remains troubled by severe insomnia although she reports that the most recent change in medications is helping.  When she was started on Remeron she finally got a full night sleep, but then she noticed that she would not be fully awake until 11:00 the next morning.  She seems to be doing better now on half the previous dose of medication.  She does describe some chest discomfort that occurs at rest, never with activity.  It sometimes occurs after meals.  It is located retrosternally and is promptly relieved by a sip of  water.  She always make sure that she has a bottle of water with her since it occurs so frequently and is so promptly relieved by drinking.  The patient specifically denies any chest pain with exertion, dyspnea at rest, orthopnea, paroxysmal nocturnal dyspnea, syncope, palpitations, focal neurological deficits, intermittent claudication, lower extremity edema, unexplained weight gain, cough, hemoptysis or wheezing.  Her daughter reports that Mrs. Marquart is a very loud snorer, which seems to catch Mrs. Prewitt by surprise.  The patient scores at least 11 points on the Epworth Sleepiness Scale, although her daughter's facial expression tell me that she may be trying to minimize her likelihood of daytime dozing.  The patient lives alone and her daughter cannot report episodes of witnessed apnea.  Otherwise, Mrs. Wieland has been quite healthy and does not have serious chronic medical problems.  Her blood pressure is well controlled.  Labs that were recently performed by Dr. Melford Aase prove that she does not have anemia or hypothyroidism or major electrolyte imbalances.  Her most recent lipid profile from 2016 was quite good with an LDL of 96 and HDL of 48.  She does not have diabetes mellitus.  Past Medical History:  Diagnosis Date  . Hypertension     Past Surgical History:  Procedure Laterality Date  . CHOLECYSTECTOMY      Current Medications: Current Meds  Medication Sig  . ALPRAZolam Duanne Moron) 1  MG tablet Take 1 mg by mouth at bedtime as needed.  Marland Kitchen aspirin 81 MG tablet Take 81 mg by mouth daily.  . furosemide (LASIX) 40 MG tablet Take 20 mg by mouth daily.  Marland Kitchen ibuprofen (ADVIL,MOTRIN) 200 MG tablet Take 400 mg by mouth every 4 (four) hours as needed for pain.  Marland Kitchen lisinopril-hydrochlorothiazide (PRINZIDE,ZESTORETIC) 20-12.5 MG per tablet Take 1 tablet by mouth daily.  . meloxicam (MOBIC) 15 MG tablet Take 1 tablet by mouth daily.  Marland Kitchen Phenylephrine-DM-GG-APAP (DELSYM COUGH/COLD DAYTIME) 5-10-200-325  MG/10ML LIQD Take by mouth.  . Vitamin D, Ergocalciferol, (DRISDOL) 50000 UNITS CAPS Take 50,000 Units by mouth every 7 (seven) days.     Allergies:   Patient has no known allergies.   Social History   Socioeconomic History  . Marital status: Widowed    Spouse name: Not on file  . Number of children: Not on file  . Years of education: Not on file  . Highest education level: Not on file  Occupational History  . Not on file  Social Needs  . Financial resource strain: Not on file  . Food insecurity:    Worry: Not on file    Inability: Not on file  . Transportation needs:    Medical: Not on file    Non-medical: Not on file  Tobacco Use  . Smoking status: Former Research scientist (life sciences)  . Smokeless tobacco: Never Used  Substance and Sexual Activity  . Alcohol use: No  . Drug use: No  . Sexual activity: Never    Birth control/protection: Post-menopausal  Lifestyle  . Physical activity:    Days per week: Not on file    Minutes per session: Not on file  . Stress: Not on file  Relationships  . Social connections:    Talks on phone: Not on file    Gets together: Not on file    Attends religious service: Not on file    Active member of club or organization: Not on file    Attends meetings of clubs or organizations: Not on file    Relationship status: Not on file  Other Topics Concern  . Not on file  Social History Narrative  . Not on file     Family History: The patient's family history includes Heart disease in her father.  ROS:   Please see the history of present illness.     All other systems reviewed and are negative.  EKGs/Labs/Other Studies Reviewed:    The following studies were reviewed today: Notes and labs from Dr. Melford Aase  EKG:  EKG is ordered today.  The ekg ordered today demonstrates sinus rhythm, nonspecific.  Otherwise normal.  Recent Labs: No results found for requested labs within last 8760 hours.  Recent Lipid Panel No results found for: CHOL, TRIG, HDL,  CHOLHDL, VLDL, LDLCALC, LDLDIRECT  Physical Exam:    VS:  BP 118/72   Pulse 74   Ht 5' 5.5" (1.664 m)   Wt 196 lb 12.8 oz (89.3 kg)   BMI 32.25 kg/m     Wt Readings from Last 3 Encounters:  04/23/18 196 lb 12.8 oz (89.3 kg)  12/27/11 200 lb (90.7 kg)     GEN: mildly obese well nourished, well developed in no acute distress HEENT: Normal NECK: No JVD; No carotid bruits LYMPHATICS: No lymphadenopathy CARDIAC: RRR, no murmurs, rubs, gallops RESPIRATORY:  Clear to auscultation without rales, wheezing or rhonchi  ABDOMEN: Soft, non-tender, non-distended MUSCULOSKELETAL:  No edema; No deformity  SKIN: Warm and  dry NEUROLOGIC:  Alert and oriented x 3 PSYCHIATRIC:  Normal affect   ASSESSMENT:    1. Shortness of breath    PLAN:    In order of problems listed above:  1. Dyspnea: Could well be explained by obesity and deconditioning, but other potential causes would be diastolic dysfunction related to long-standing hypertension and/or pulmonary hypertension related to untreated sleep apnea.  We will check an echocardiogram to see if there are signs of left ventricular hypertrophy, right heart chamber enlargement or elevated PA pressure. 2. Hypersomnolence: She gives numerous symptoms that are highly suggestive of obstructive sleep apnea.  She is mildly obese and has hypertension.  It is also possible that her daytime sleepiness is secondary to chronic insomnia, related to anxiety and depression following her son's death and sleep deprivation.  She does not think that she has sleep apnea.  She does agree that we can consider a sleep study if there is any indication of right heart overload on the echocardiogram. 3. HTN: Well treated based on blood pressure recorded today. 4. Mild obesity: Regardless of the findings of her work-up, she would benefit from some weight loss.   Medication Adjustments/Labs and Tests Ordered: Current medicines are reviewed at length with the patient today.   Concerns regarding medicines are outlined above.  Orders Placed This Encounter  Procedures  . ECHOCARDIOGRAM COMPLETE   No orders of the defined types were placed in this encounter.   Patient Instructions  Medication Instructions:  Dr Sallyanne Kuster recommends that you continue on your current medications as directed. Please refer to the Current Medication list given to you today.  If you need a refill on your cardiac medications before your next appointment, please call your pharmacy.   Testing/Procedures: Your physician has requested that you have an echocardiogram. Echocardiography is a painless test that uses sound waves to create images of your heart. It provides your doctor with information about the size and shape of your heart and how well your heart's chambers and valves are working. This procedure takes approximately one hour. There are no restrictions for this procedure.  >> This will be performed at our [facility] location [place], [place] [address] [phone]  Follow-Up: Dr Sallyanne Kuster recommends that you follow-up with him as needed.    Signed, Sanda Klein, MD  04/23/2018 5:38 PM    [place]

## 2018-04-23 NOTE — Patient Instructions (Signed)
Medication Instructions:  Dr Sallyanne Kuster recommends that you continue on your current medications as directed. Please refer to the Current Medication list given to you today.  If you need a refill on your cardiac medications before your next appointment, please call your pharmacy.   Testing/Procedures: Your physician has requested that you have an echocardiogram. Echocardiography is a painless test that uses sound waves to create images of your heart. It provides your doctor with information about the size and shape of your heart and how well your heart's chambers and valves are working. This procedure takes approximately one hour. There are no restrictions for this procedure.  >> This will be performed at our [facility] location [place], [place] [address] [phone]  Follow-Up: Dr Sallyanne Kuster recommends that you follow-up with him as needed.

## 2018-04-24 NOTE — Addendum Note (Signed)
Addended by: Diana Eves on: 04/24/2018 06:03 PM   Modules accepted: Orders

## 2018-05-21 ENCOUNTER — Other Ambulatory Visit (HOSPITAL_COMMUNITY): Payer: Medicare Other

## 2018-06-18 ENCOUNTER — Other Ambulatory Visit: Payer: Self-pay

## 2018-06-18 ENCOUNTER — Ambulatory Visit (HOSPITAL_COMMUNITY): Payer: Medicare Other | Attending: Cardiology

## 2018-06-18 DIAGNOSIS — R0602 Shortness of breath: Secondary | ICD-10-CM

## 2023-01-03 ENCOUNTER — Emergency Department (HOSPITAL_COMMUNITY): Payer: Medicare Other

## 2023-01-03 ENCOUNTER — Other Ambulatory Visit: Payer: Self-pay

## 2023-01-03 ENCOUNTER — Emergency Department (HOSPITAL_COMMUNITY)
Admission: EM | Admit: 2023-01-03 | Discharge: 2023-01-03 | Disposition: A | Discharge status: Home / Self Care | Payer: Medicare Other | Attending: Emergency Medicine | Admitting: Emergency Medicine

## 2023-01-03 DIAGNOSIS — I10 Essential (primary) hypertension: Secondary | ICD-10-CM | POA: Insufficient documentation

## 2023-01-03 DIAGNOSIS — J181 Lobar pneumonia, unspecified organism: Secondary | ICD-10-CM | POA: Diagnosis not present

## 2023-01-03 DIAGNOSIS — J189 Pneumonia, unspecified organism: Secondary | ICD-10-CM

## 2023-01-03 DIAGNOSIS — R0602 Shortness of breath: Secondary | ICD-10-CM | POA: Diagnosis present

## 2023-01-03 DIAGNOSIS — Z20822 Contact with and (suspected) exposure to covid-19: Secondary | ICD-10-CM | POA: Insufficient documentation

## 2023-01-03 DIAGNOSIS — J40 Bronchitis, not specified as acute or chronic: Secondary | ICD-10-CM | POA: Insufficient documentation

## 2023-01-03 LAB — CBC WITH DIFFERENTIAL/PLATELET
Abs Immature Granulocytes: 0.06 10*3/uL (ref 0.00–0.07)
Basophils Absolute: 0 10*3/uL (ref 0.0–0.1)
Basophils Relative: 0 %
Eosinophils Absolute: 0.1 10*3/uL (ref 0.0–0.5)
Eosinophils Relative: 0 %
HCT: 32.8 % — ABNORMAL LOW (ref 36.0–46.0)
Hemoglobin: 10.6 g/dL — ABNORMAL LOW (ref 12.0–15.0)
Immature Granulocytes: 0 %
Lymphocytes Relative: 12 %
Lymphs Abs: 1.6 10*3/uL (ref 0.7–4.0)
MCH: 29.8 pg (ref 26.0–34.0)
MCHC: 32.3 g/dL (ref 30.0–36.0)
MCV: 92.1 fL (ref 80.0–100.0)
Monocytes Absolute: 1.5 10*3/uL — ABNORMAL HIGH (ref 0.1–1.0)
Monocytes Relative: 11 %
Neutro Abs: 10.3 10*3/uL — ABNORMAL HIGH (ref 1.7–7.7)
Neutrophils Relative %: 77 %
Platelets: 311 10*3/uL (ref 150–400)
RBC: 3.56 MIL/uL — ABNORMAL LOW (ref 3.87–5.11)
RDW: 17.2 % — ABNORMAL HIGH (ref 11.5–15.5)
WBC: 13.6 10*3/uL — ABNORMAL HIGH (ref 4.0–10.5)
nRBC: 0 % (ref 0.0–0.2)

## 2023-01-03 LAB — BRAIN NATRIURETIC PEPTIDE: B Natriuretic Peptide: 140.4 pg/mL — ABNORMAL HIGH (ref 0.0–100.0)

## 2023-01-03 LAB — BASIC METABOLIC PANEL
Anion gap: 13 (ref 5–15)
BUN: 25 mg/dL — ABNORMAL HIGH (ref 8–23)
CO2: 26 mmol/L (ref 22–32)
Calcium: 8.3 mg/dL — ABNORMAL LOW (ref 8.9–10.3)
Chloride: 97 mmol/L — ABNORMAL LOW (ref 98–111)
Creatinine, Ser: 1.17 mg/dL — ABNORMAL HIGH (ref 0.44–1.00)
GFR, Estimated: 47 mL/min — ABNORMAL LOW (ref >60)
Glucose, Bld: 108 mg/dL — ABNORMAL HIGH (ref 70–99)
Potassium: 3.6 mmol/L (ref 3.5–5.1)
Sodium: 136 mmol/L (ref 135–145)

## 2023-01-03 LAB — SARS CORONAVIRUS 2 BY RT PCR: SARS Coronavirus 2 by RT PCR: NEGATIVE

## 2023-01-03 LAB — TROPONIN I (HIGH SENSITIVITY)
Troponin I (High Sensitivity): 23 ng/L — ABNORMAL HIGH (ref <18)
Troponin I (High Sensitivity): 21 ng/L — ABNORMAL HIGH (ref <18)

## 2023-01-03 MED ORDER — IPRATROPIUM BROMIDE 0.02 % IN SOLN
0.5000 mg | Freq: Once | RESPIRATORY_TRACT | Status: AC
  Administered 2023-01-03: 0.5 mg via RESPIRATORY_TRACT
  Filled 2023-01-03: qty 2.5

## 2023-01-03 MED ORDER — ALBUTEROL SULFATE (5 MG/ML) 0.5% IN NEBU: 2.5000 mg | INHALATION_SOLUTION | Freq: Four times a day (QID) | RESPIRATORY_TRACT | 0 refills | Status: DC | PRN

## 2023-01-03 MED ORDER — IPRATROPIUM-ALBUTEROL 0.5-2.5 (3) MG/3ML IN SOLN
3.0000 mL | Freq: Once | RESPIRATORY_TRACT | Status: AC
  Administered 2023-01-03: 3 mL via RESPIRATORY_TRACT
  Filled 2023-01-03: qty 3

## 2023-01-03 MED ORDER — IOHEXOL 350 MG/ML SOLN
75.0000 mL | Freq: Once | INTRAVENOUS | Status: AC | PRN
  Administered 2023-01-03: 75 mL via INTRAVENOUS

## 2023-01-03 NOTE — ED Provider Notes (Signed)
EMERGENCY DEPARTMENT AT [facility] Provider Note   CSN: [id number] Arrival date & time: 01/03/23  1336     History  Chief Complaint  Patient presents with   Abnormal Lab    Heather Roth is a 82 y.o. female history of hypertension, here presenting with abnormal labs.  Patient states that she has been tired and short of breath.  Patient was seen by primary care doctor recently and had a chest x-ray that showed pneumonia.  Patient received a shot of Rocephin yesterday.  Patient was started on moxifloxacin.  Patient had labs drawn and white blood cell count was elevated.  She was told show BNP was 1400.  Patient states that she has some leg swelling.  Patient was sent here for abnormal labs.  Patient denies any COVID exposure.  The history is provided by the patient.       Home Medications Prior to Admission medications   Medication Sig Start Date End Date Taking? Authorizing Provider  acetaminophen (TYLENOL) 325 MG tablet Take 325 mg by mouth every 6 (six) hours as needed for moderate pain.   Yes [provider]  ALPRAZolam Prudy Feeler) 1 MG tablet Take 1 mg by mouth 3 (three) times daily as needed for anxiety.   Yes [provider]  Ascorbic Acid (VITAMIN C) 1000 MG tablet Take 1,000 mg by mouth daily.   Yes [provider]  Calcium Carbonate-Vitamin D (OYSTER SHELL CALCIUM/D) 500-5 MG-MCG TABS Take 2 tablets by mouth daily. 04/24/21  Yes [provider]  Cholecalciferol (VITAMIN D) 125 MCG (5000 UT) CAPS Take 5,000 Units by mouth daily.   Yes [provider]  fluticasone (FLONASE) 50 MCG/ACT nasal spray Place 1 spray into both nostrils daily as needed for allergies or rhinitis.   Yes [provider]  furosemide (LASIX) 40 MG tablet Take 20 mg by mouth 2 (two) times daily. 11/17/13  Yes [provider]  Menthol, Topical Analgesic, (ICY HOT) 5 % PTCH Apply 1 patch topically daily as needed (pain).   Yes  [provider]  moxifloxacin (AVELOX) 400 MG tablet Take 400 mg by mouth daily. 01/02/23 01/09/23 Yes [provider]  Multiple Vitamin (MULTIVITAMIN WITH MINERALS) TABS tablet Take 1 tablet by mouth daily.   Yes [provider]  oxymetazoline (AFRIN) 0.05 % nasal spray Place 1 spray into both nostrils daily as needed for congestion.   Yes [provider]      Allergies    Patient has no known allergies.    Review of Systems   Review of Systems  Respiratory:  Positive for shortness of breath.   All other systems reviewed and are negative.   Physical Exam Updated Vital Signs BP 118/66   Pulse 86   Temp 98.6 F (37 C)   Resp 17   Ht 5' 5.5" (1.664 m)   Wt 89.3 kg   SpO2 95%   BMI 32.26 kg/m  Physical Exam Vitals and nursing note reviewed.  HENT:     Head: Normocephalic.     Nose: Nose normal.     Mouth/Throat:     Mouth: Mucous membranes are moist.  Eyes:     Extraocular Movements: Extraocular movements intact.     Pupils: Pupils are equal, round, and reactive to light.  Cardiovascular:     Rate and Rhythm: Normal rate and regular rhythm.     Pulses: Normal pulses.  Pulmonary:     Comments: R base crackles  Abdominal:     General: Abdomen is flat.     Palpations: Abdomen is soft.  Musculoskeletal:        General: Normal range of motion.     Cervical back: Normal range of motion and neck supple.  Skin:    General: Skin is warm.     Capillary Refill: Capillary refill takes less than 2 seconds.  Neurological:     General: No focal deficit present.     Mental Status: She is alert and oriented to person, place, and time.  Psychiatric:        Mood and Affect: Mood normal.        Behavior: Behavior normal.     ED Results / Procedures / Treatments   Labs (all labs ordered are listed, but only abnormal results are displayed) Labs Reviewed  BASIC METABOLIC PANEL - Abnormal; Notable for the following components:      Result Value    Chloride 97 (*)    Glucose, Bld 108 (*)    BUN 25 (*)    Creatinine, Ser 1.17 (*)    Calcium 8.3 (*)    GFR, Estimated 47 (*)    All other components within normal limits  CBC WITH DIFFERENTIAL/PLATELET - Abnormal; Notable for the following components:   WBC 13.6 (*)    RBC 3.56 (*)    Hemoglobin 10.6 (*)    HCT 32.8 (*)    RDW 17.2 (*)    Neutro Abs 10.3 (*)    Monocytes Absolute 1.5 (*)    All other components within normal limits  BRAIN NATRIURETIC PEPTIDE - Abnormal; Notable for the following components:   B Natriuretic Peptide 140.4 (*)    All other components within normal limits  TROPONIN I (HIGH SENSITIVITY) - Abnormal; Notable for the following components:   Troponin I (High Sensitivity) 23 (*)    All other components within normal limits  TROPONIN I (HIGH SENSITIVITY) - Abnormal; Notable for the following components:   Troponin I (High Sensitivity) 21 (*)    All other components within normal limits  SARS CORONAVIRUS 2 BY RT PCR    EKG EKG Interpretation Date/Time:  Friday January 03 2023 14:45:30 EDT Ventricular Rate:  84 PR Interval:  162 QRS Duration:  72 QT Interval:  358 QTC Calculation: 423 R Axis:   47  Text Interpretation: Normal sinus rhythm Nonspecific T wave abnormality Abnormal ECG When compared with ECG of 22-Feb-2002 13:36, PREVIOUS ECG IS PRESENT Confirmed by Richardean Canal [phone]) on 01/03/2023 6:50:21 PM  Radiology CT Angio Chest PE W and/or Wo Contrast  Result Date: 01/03/2023 CLINICAL DATA:  Pulmonary embolism suspected.  High probability. EXAM: CT ANGIOGRAPHY CHEST WITH CONTRAST TECHNIQUE: Multidetector CT imaging of the chest was performed using the standard protocol during bolus administration of intravenous contrast. Multiplanar CT image reconstructions and MIPs were obtained to evaluate the vascular anatomy. RADIATION DOSE REDUCTION: This exam was performed according to the departmental dose-optimization program which includes automated  exposure control, adjustment of the mA and/or kV according to patient size and/or use of iterative reconstruction technique. CONTRAST:  75mL OMNIPAQUE IOHEXOL 350 MG/ML SOLN COMPARISON:  PA and lateral chest today, PA and lateral chest 01/06/2008. No prior cross-sectional imaging. FINDINGS: Cardiovascular: There is mild cardiomegaly and a small anterior pericardial effusion. There are three-vessel coronary artery calcifications greatest in the LAD and circumflex. The pulmonary arteries are normal in caliber without appreciable emboli. The pulmonary veins are nondilated. There are mild-to-moderate calcific plaques in  the arch and descending aorta, scattered calcific plaques of the great vessels. No aortic or great vessel aneurysm, stenosis or dissection are seen. Mediastinum/Nodes: There is mild circumferential wall thickening of the thoracic esophagus most likely due to esophagitis and most likely due to reflux. Endoscopic follow-up may be indicated to exclude underlying lesion. There is a 1.7 cm heterogeneous nodule in the inferior pole of the left lobe of the thyroid gland.Recommend non-emergent thyroid ultrasound. Reference: J Am Coll Radiol. 2015 Feb;12(2): 143-50. There are additional scattered 5-6 mm hyperdense nodules in both thyroid lobes as well. There is a slightly prominent right mid hilar lymph node measuring 1.1 cm in short axis on 4:58. There is no further intrathoracic adenopathy. Axillary spaces are clear. The trachea and main bronchi are without filling defects. Lungs/Pleura: Small layering right pleural effusion. No left pleural effusion. No pneumothorax. The lungs are mildly emphysematous with centrilobular changes predominating. There is patchy airspace disease with air bronchograms extending along the fissure within the anterior and posterior segments of the right upper lobe. There is diffuse bronchial thickening. Scattered linear scar-like opacities both bases. There is tree-in-bud interstitial  change laterally in the right middle lobe which could be due to chronic small airways disease or pneumonitis. Remainder of the lung fields are clear. Upper Abdomen: The liver is mildly steatotic and mildly enlarged. There is slight irregularity in the anterior capsular surface in the left lobe suggesting early cirrhosis. No acute or other significant upper abdominal findings. Abdominal aortic atherosclerosis. Musculoskeletal: There is osteopenia with degenerative changes of the spine. No acute or other significant osseous findings. Review of the MIP images confirms the above findings. IMPRESSION: 1. No evidence of arterial dilatation or embolus. 2. Cardiomegaly with small anterior pericardial effusion and three-vessel calcific CAD. 3. Small right pleural effusion. 4. Right upper lobe airspace disease with air bronchograms consistent with pneumonia. Follow-up study recommended after treatment to ensure clearing. 5. Tree-in-bud interstitial change laterally in the right middle lobe which could be due to chronic small airways disease or pneumonitis. 6. COPD with diffuse bronchial thickening. 7. 1.7 cm heterogeneous nodule in the inferior pole of the left lobe of the thyroid gland. Recommend non-emergent thyroid ultrasound. 8. Aortic and coronary artery atherosclerosis. 9. Mildly enlarged and steatotic liver with slight irregularity in the anterior capsular surface in the left lobe suggesting early cirrhosis. 10. Osteopenia and degenerative change. 11. Probable reflux esophagitis. Endoscopic follow-up may be indicated to exclude underlying lesion. Electronically Signed   By: Almira Bar M.D.   On: 01/03/2023 20:16   DG Chest 2 View  Result Date: 01/03/2023 CLINICAL DATA:  Shortness of breath. EXAM: CHEST - 2 VIEW COMPARISON:  Two-view chest x-ray 01/06/2008 FINDINGS: Heart size is normal. Asymmetric airspace disease is present in the lateral aspect of the right upper lobe. Changes of COPD are noted. The left lung  is clear. IMPRESSION: 1. Asymmetric airspace disease in the lateral aspect of the right upper lobe consistent with pneumonia. 2. COPD. Electronically Signed   By: Marin Roberts M.D.   On: 01/03/2023 16:17    Procedures Procedures    Angiocath insertion Performed by: Richardean Canal  Consent: Verbal consent obtained. Risks and benefits: risks, benefits and alternatives were discussed Time out: Immediately prior to procedure a "time out" was called to verify the correct patient, procedure, equipment, support staff and site/side marked as required.  Preparation: Patient was prepped and draped in the usual sterile fashion.  Vein Location: R antecube   Ultrasound Guided  Gauge: 20 long   Normal blood return and flush without difficulty Patient tolerance: Patient tolerated the procedure well with no immediate complications.    Medications Ordered in ED Medications  iohexol (OMNIPAQUE) 350 MG/ML injection 75 mL (75 mLs Intravenous Contrast Given 01/03/23 1956)  ipratropium-albuterol (DUONEB) 0.5-2.5 (3) MG/3ML nebulizer solution 3 mL (3 mLs Nebulization Given 01/03/23 2056)  ipratropium (ATROVENT) nebulizer solution 0.5 mg (0.5 mg Nebulization Given 01/03/23 2056)    ED Course/ Medical Decision Making/ A&P                             Medical Decision Making OLUWADEMILADE SANDE is a 82 y.o. female here presenting with shortness of breath.  Patient has shortness of breath and has not been very active.  Consider PE versus pneumonia versus COVID.  Patient is also concerned for heart failure.  Plan to get CBC and CMP and BNP and troponin and CTA chest  10:01 PM Labs show white blood cell count 13.6.  Troponin is stable at 21.  COVID test is negative.  CTA showed pneumonia.  Patient also has some bronchial thickening as well.  Patient has other incidental findings.  Patient has some wheezing initially and was given albuterol nebs and now feeling better.  Family has albuterol nebulizer at home.   Will prescribe albuterol as needed patient already has prescription for antibiotics.  Problems Addressed: Bronchitis: acute illness or injury Community acquired pneumonia of right upper lobe of lung: acute illness or injury  Amount and/or Complexity of Data Reviewed Labs: ordered. Decision-making details documented in ED Course. Radiology: ordered and independent interpretation performed. Decision-making details documented in ED Course.  Risk Prescription drug management.    Final Clinical Impression(s) / ED Diagnoses Final diagnoses:  None    Rx / DC Orders ED Discharge Orders     None         Charlynne Pander, MD 01/03/23 2204

## 2023-01-03 NOTE — Discharge Instructions (Addendum)
As we discussed, your CT scan showed pneumonia  Please use albuterol as needed for cough and wheezing  Please continue your antibiotics  See your doctor for follow-up  Continue your Lasix  Return to ER if you have worse wheezing or cough or trouble breathing or leg swelling

## 2023-01-03 NOTE — ED Triage Notes (Signed)
Pt sent by PCP for pneumonia and abnormal labs. Pt has elevated WBC and BNP

## 2023-01-03 NOTE — ED Notes (Signed)
Edp at bedside °

## 2023-01-03 NOTE — ED Notes (Signed)
2 missed IV attempts, 1 US guided, [name], MD notified.

## 2023-01-03 NOTE — ED Provider Triage Note (Signed)
Emergency Medicine Provider Triage Evaluation Note  Heather Roth , a 82 y.o. female  was evaluated in triage.  Pt complains of shortness of breath.  Seen recently and told she had pneumonia.  Given a shot of Rocephin and started on moxifloxacin.  Patient has had worsening exertional dyspnea and cough with difficulty breathing.  Patient was told she needed to come to the ER because her "BNP was extremely high.  No history of heart failure..  Review of Systems  Positive: This of breath Negative: Fever  Physical Exam  BP (!) 123/112 (BP Location: Right Arm)   Pulse 87   Temp 98.3 F (36.8 C) (Oral)   Resp (!) 28   Ht 5' 5.5" (1.664 m)   Wt 89.3 kg   SpO2 95%   BMI 32.26 kg/m  Gen:   Awake, no distress   Resp:  Prolonged effort MSK:   Moves extremities without difficulty  Other:  Edema in the lower extremities  Medical Decision Making  Medically screening exam initiated at 3:15 PM.  Appropriate orders placed.  [name] was informed that the remainder of the evaluation will be completed by another provider, this initial triage assessment does not replace that evaluation, and the importance of remaining in the ED until their evaluation is complete.     Arthor Captain, PA-C 01/04/23 2017

## 2023-01-04 ENCOUNTER — Other Ambulatory Visit (HOSPITAL_BASED_OUTPATIENT_CLINIC_OR_DEPARTMENT_OTHER): Payer: Self-pay

## 2023-03-27 ENCOUNTER — Ambulatory Visit
Admission: RE | Admit: 2023-03-27 | Discharge: 2023-03-27 | Disposition: A | Discharge status: Home / Self Care | Payer: Medicare Other | Source: Ambulatory Visit | Attending: Adult Health Nurse Practitioner | Admitting: Adult Health Nurse Practitioner

## 2023-03-27 ENCOUNTER — Other Ambulatory Visit: Payer: Self-pay | Admitting: Adult Health Nurse Practitioner

## 2023-03-27 DIAGNOSIS — R0609 Other forms of dyspnea: Secondary | ICD-10-CM

## 2023-03-27 MED ORDER — IOPAMIDOL (ISOVUE-370) INJECTION 76%
75.0000 mL | Freq: Once | INTRAVENOUS | Status: AC | PRN
  Administered 2023-03-27: 75 mL via INTRAVENOUS

## 2023-06-24 ENCOUNTER — Ambulatory Visit: Payer: Medicare Other | Admitting: Cardiovascular Disease

## 2023-08-20 ENCOUNTER — Ambulatory Visit: Payer: Medicare Other | Attending: Cardiovascular Disease | Admitting: Cardiovascular Disease

## 2023-08-20 ENCOUNTER — Encounter: Payer: Self-pay | Admitting: Cardiovascular Disease

## 2023-08-20 ENCOUNTER — Ambulatory Visit (INDEPENDENT_AMBULATORY_CARE_PROVIDER_SITE_OTHER)

## 2023-08-20 VITALS — BP 120/60 | HR 53 | Ht 65.5 in | Wt 183.4 lb

## 2023-08-20 DIAGNOSIS — R0609 Other forms of dyspnea: Secondary | ICD-10-CM

## 2023-08-20 DIAGNOSIS — R55 Syncope and collapse: Secondary | ICD-10-CM

## 2023-08-20 NOTE — Progress Notes (Signed)
 Cardiology Consultation Note:    Date:  08/24/2023   ID:  Heather Roth, DOB [birth year], MRN [id number]  PCP:  Avel Peace Chales Salmon, MD  Cardiologist:  Thurmon Fair, MD  Electrophysiologist:  None   Referring MD: Pollyann Samples, MD   No chief complaint on file.    History of Present Illness:    Heather Roth is a 83 y.o. female with a hx of excellent health except for systemic hypertension, cholecystectomy and persistent fatigue who has recently developed complaints of near syncope.  The episodes only occur when she is upright, usually when standing but sometimes when sitting down, such as happened in a restaurant recently.  She feels very weak.  She develops splotchy discoloration of both forearms with a rather reticulated, marmorata pattern.  She showed me some photographs of it and it is quite striking.  She feels that she may pass out.  She feels immediately better if she lies in bed, especially if she keeps her legs elevated.  She continues to live independently at home.  She has been living alone since her son died from metastatic prostate cancer (she was his primary caregiver for several years).  When last seen in 2020 her complaints were of sleeplessness and fatigue and she was still dealing with the loss of her son and her disturbed sleep-wake cycle during that time.  In the past she has had some problems with chest discomfort that is retrosternal and a sensation of fullness but is prone to relief by sip of water.  For this reason she often keeps a bottle of water with her.  The chest discomfort has not been more frequent recently.  She does not have any chest pain with physical exertion. She has not had full-blown syncope and denies palpitations or focal neurological events.  She does not have intermittent claudication.  Her daughter reports that Heather Roth is a very loud snorer, which seems to catch Heather Roth by surprise.  The patient scores at least 11 points on the Epworth  Sleepiness Scale, although her daughter's facial expression tell me that she may be trying to minimize her likelihood of daytime dozing.  The patient lives alone and her daughter cannot report episodes of witnessed apnea.  She has well-controlled hypertension.  In fact her diastolic blood pressure is borderline low.  She is bradycardic today but does not take any medications that could be causing bradycardia.  Labs performed in the last few months showed normal thyroid function, normal electrolytes except for borderline sodium of 145, Normal fasting glucose, creatinine 0.88, hemoglobin 12.0, normal liver function tests except for minimally elevated total bilirubin 2.0 normal iron studies.  In August her BNP was minimally elevated at 146.4.  The most recent lipid profile is from May 2023 and showed total cholesterol 166, triglycerides 94, HDL 55, LDL 94.  She had a normal echocardiogram in 2020, which is the last time she was seen in the cardiology office.  Past Medical History:  Diagnosis Date   Hypertension     Past Surgical History:  Procedure Laterality Date   CHOLECYSTECTOMY      Current Medications: Current Meds  Medication Sig   Acetaminophen 500 MG capsule Take 325 mg by mouth every 6 (six) hours as needed for moderate pain (pain score 4-6).   ALPRAZolam (XANAX) 1 MG tablet Take 1 mg by mouth 3 (three) times daily as needed for anxiety.   Ascorbic Acid (VITAMIN C) 1000 MG tablet Take 1,000 mg by  mouth daily.   Calcium Carbonate-Vitamin D (OYSTER SHELL CALCIUM/D) 500-5 MG-MCG TABS Take 2 tablets by mouth daily.   Cholecalciferol (VITAMIN D) 125 MCG (5000 UT) CAPS Take 5,000 Units by mouth daily.   escitalopram (LEXAPRO) 10 MG tablet Take 10 mg by mouth daily.   fluticasone (FLONASE) 50 MCG/ACT nasal spray Place 1 spray into both nostrils daily as needed for allergies or rhinitis.   furosemide (LASIX) 40 MG tablet Take 20 mg by mouth 2 (two) times daily.   Menthol, Topical  Analgesic, (ICY HOT) 5 % PTCH Apply 1 patch topically daily as needed (pain).   Multiple Vitamin (MULTIVITAMIN WITH MINERALS) TABS tablet Take 1 tablet by mouth daily.   TRELEGY ELLIPTA 100-62.5-25 MCG/ACT AEPB Inhale into the lungs daily at 12 noon.     Allergies:   Patient has no known allergies.   Social History   Socioeconomic History   Marital status: Widowed    Spouse name: Not on file   Number of children: Not on file   Years of education: Not on file   Highest education level: Not on file  Occupational History   Not on file  Tobacco Use   Smoking status: Former   Smokeless tobacco: Never  Substance and Sexual Activity   Alcohol use: No   Drug use: No   Sexual activity: Never    Birth control/protection: Post-menopausal  Other Topics Concern   Not on file  Social History Narrative   Not on file   Social Drivers of Health   Financial Resource Strain: Low Risk  (08/18/2023)   Received from [facility]   Overall Financial Resource Strain (CARDIA)    Difficulty of Paying Living Expenses: Not hard at all  Food Insecurity: No Food Insecurity (08/18/2023)   Received from [facility]   Hunger Vital Sign    Worried About Running Out of Food in the Last Year: Never true    Ran Out of Food in the Last Year: Never true  Transportation Needs: No Transportation Needs (08/18/2023)   Received from [facility] - Transportation    Lack of Transportation (Medical): No    Lack of Transportation (Non-Medical): No  Physical Activity: Unknown (06/09/2023)   Received from [facility]   Exercise Vital Sign    Days of Exercise per Week: 0 days    Minutes of Exercise per Session: Not on file  Stress: No Stress Concern Present (06/09/2023)   Received from [facility] - Occupational Stress Questionnaire    Feeling of Stress : Not at all  Social Connections: Socially Integrated (06/09/2023)   Received from [facility]    Social Network    How would you rate your social network (family, work, friends)?: Good participation with social networks     Family History: The patient's family history includes Heart disease in her father.    The following studies were reviewed today: Echocardiogram 2020   - Left ventricle: The cavity size was normal. There was mild    concentric hypertrophy. Systolic function was normal. The    estimated ejection fraction was in the range of 55% to 60%. Wall    motion was normal; there were no regional wall motion    abnormalities. Left ventricular diastolic function parameters    were normal.  - Aortic valve: There was no regurgitation.  - Left atrium: The atrium was normal in size.  - Right atrium: The atrium was normal in  size.  - Tricuspid valve: There was no significant regurgitation.  - Pulmonic valve: There was no regurgitation.  - Pulmonary arteries: Systolic pressure could not be accurately    estimated.  - Inferior vena cava: The vessel was normal in size.  - Pericardium, extracardiac: There was no pericardial effusion.   Impressions:   - False tendon in the LV apex. There appears to be    hyper-trabeculation of the left ventricle, additional images with    Definity echo-contrast are recommended. However, systolic and    diastolic function are normal.   EKG:    EKG Interpretation Date/Time:  Wednesday August 20 2023 15:48:45 EDT Ventricular Rate:  53 PR Interval:    QRS Duration:  76 QT Interval:  432 QTC Calculation: 405 R Axis:   14  Text Interpretation: Sinus bradycardia When compared with ECG of 03-Jan-2023 14:45, Junctional rhythm has replaced Sinus rhythm Vent. rate has decreased BY  31 BPM Nonspecific T wave abnormality, improved in Lateral leads Confirmed by Tracer Gutridge [phone]) on 08/20/2023 4:28:00 PM         Recent Labs: 01/03/2023: B Natriuretic Peptide 140.4; BUN 25; Creatinine, Ser 1.17; Hemoglobin 10.6; Platelets 311; Potassium 3.6;  Sodium 136  Recent Lipid Panel No results found for: "CHOL", "TRIG", "HDL", "CHOLHDL", "VLDL", "LDLCALC", "LDLDIRECT" May 2023 Cholesterol 166, triglycerides 94, HDL 55, LDL 94  Physical Exam:    VS:  BP 120/60 (BP Location: Left Arm, Patient Position: Sitting)   Pulse (!) 53   Ht 5' 5.5" (1.664 m)   Wt 183 lb 6.4 oz (83.2 kg)   SpO2 96%   BMI 30.06 kg/m     Wt Readings from Last 3 Encounters:  08/20/23 183 lb 6.4 oz (83.2 kg)  01/03/23 196 lb 13.9 oz (89.3 kg)  04/23/18 196 lb 12.8 oz (89.3 kg)      General: Alert, oriented x3, no distress, borderline obese Head: no evidence of trauma, PERRL, EOMI, no exophtalmos or lid lag, no myxedema, no xanthelasma; normal ears, nose and oropharynx Neck: normal jugular venous pulsations and no hepatojugular reflux; brisk carotid pulses without delay and no carotid bruits Chest: clear to auscultation, no signs of consolidation by percussion or palpation, normal fremitus, symmetrical and full respiratory excursions Cardiovascular: normal position and quality of the apical impulse, regular rhythm, normal first and second heart sounds, no murmurs, rubs or gallops Abdomen: no tenderness or distention, no masses by palpation, no abnormal pulsatility or arterial bruits, normal bowel sounds, no hepatosplenomegaly Extremities: no clubbing, cyanosis or edema; 2+ radial, ulnar and brachial pulses bilaterally; 2+ right femoral, posterior tibial and dorsalis pedis pulses; 2+ left femoral, posterior tibial and dorsalis pedis pulses; no subclavian or femoral bruits Neurological: grossly nonfocal Psych: Normal mood and affect   ASSESSMENT:    1. DOE (dyspnea on exertion)   2. Near syncope    PLAN:    In order of problems listed above:  Near syncope: I think she is describing episodes of near syncope related either to severe bradycardia or to hypotension.  The discoloration/rash that occurs on the forearms may simply be a vasoconstrictor response.  The  fact that her weakness and the rash both promptly resolved when she lies in bed suggest that the mechanism may indeed be decreased cardiac output.  She is quite bradycardic today without taking any medications to cause this.  Will schedule her for an event monitor. HTN: She has a history of hypertension, but her blood pressure is actually quite normal, even  a low diastolic blood pressure without any medications today other than furosemide twice daily, which she takes for lower extremity edema. Mild obesity: She has lost some weight since her previous appointment and is now only borderline obese/overweight.   Medication Adjustments/Labs and Tests Ordered: Current medicines are reviewed at length with the patient today.  Concerns regarding medicines are outlined above.  Orders Placed This Encounter  Procedures   LONG TERM MONITOR (3-14 DAYS)   EKG 12-Lead   No orders of the defined types were placed in this encounter.   Patient Instructions  Medication Instructions:  No changes  *If you need a refill on your cardiac medications before your next appointment, please call your pharmacy*  Testing/Procedures: ZIO XT- Long Term Monitor Instructions  Your physician has requested you wear a ZIO patch monitor for 7 days.  This is a single patch monitor. [employer] supplies one patch monitor per enrollment. Additional stickers are not available. Please do not apply patch if you will be having a Nuclear Stress Test,  Echocardiogram, Cardiac CT, MRI, or Chest Xray during the period you would be wearing the  monitor. The patch cannot be worn during these tests. You cannot remove and re-apply the  ZIO XT patch monitor.  Your ZIO patch monitor will be mailed 3 day USPS to your address on file. It may take 3-5 days  to receive your monitor after you have been enrolled.  Once you have received your monitor, please review the enclosed instructions. Your monitor  has already been registered assigning a  specific monitor serial # to you.  Billing and Patient Assistance Program Information  We have supplied [employer] with any of your insurance information on file for billing purposes. [employer] offers a sliding scale Patient Assistance Program for patients that do not have  insurance, or whose insurance does not completely cover the cost of the ZIO monitor.  You must apply for the Patient Assistance Program to qualify for this discounted rate.  To apply, please call [employer] at [phone], select option 4, select option 2, ask to apply for  Patient Assistance Program. Meredeth Ide will ask your household income, and how many people  are in your household. They will quote your out-of-pocket cost based on that information.  [employer] will also be able to set up a 73-month, interest-free payment plan if needed.  Applying the monitor   Shave hair from upper left chest.  Hold abrader disc by orange tab. Rub abrader in 40 strokes over the upper left chest as  indicated in your monitor instructions.  Clean area with 4 enclosed alcohol pads. Let dry.  Apply patch as indicated in monitor instructions. Patch will be placed under collarbone on left  side of chest with arrow pointing upward.  Rub patch adhesive wings for 2 minutes. Remove white label marked "1". Remove the white  label marked "2". Rub patch adhesive wings for 2 additional minutes.  While looking in a mirror, press and release button in center of patch. A small green light will  flash 3-4 times. This will be your only indicator that the monitor has been turned on.  Do not shower for the first 24 hours. You may shower after the first 24 hours.  Press the button if you feel a symptom. You will hear a small click. Record Date, Time and  Symptom in the Patient Logbook.  When you are ready to remove the patch, follow instructions on the last 2 pages of Patient  Logbook. Stick patch  monitor onto the last page of Patient Logbook.  Place Patient  Logbook in the blue and white box. Use locking tab on box and tape box closed  securely. The blue and white box has prepaid postage on it. Please place it in the mailbox as  soon as possible. Your physician should have your test results approximately 7 days after the  monitor has been mailed back to [facility].  Call [facility] Customer Care at [phone] if you have questions regarding  your ZIO XT patch monitor. Call them immediately if you see an orange light blinking on your  monitor.  If your monitor falls off in less than 4 days, contact our Monitor department at [phone].  If your monitor becomes loose or falls off after 4 days call [employer] at [phone] for  suggestions on securing your monitor    Follow-Up: At [facility], you and your health needs are our priority.  As part of our continuing mission to provide you with exceptional heart care, we have created designated Provider Care Teams.  These Care Teams include your primary Cardiologist (physician) and Advanced Practice Providers (APPs -  Physician Assistants and Nurse Practitioners) who all work together to provide you with the care you need, when you need it.  We recommend signing up for the patient portal called "MyChart".  Sign up information is provided on this After Visit Summary.  MyChart is used to connect with patients for Virtual Visits (Telemedicine).  Patients are able to view lab/test results, encounter notes, upcoming appointments, etc.  Non-urgent messages can be sent to your provider as well.   To learn more about what you can do with MyChart, go to [email or web address].    Your next appointment:   4 month(s)  Provider:   Thurmon Fair, MD     Other Instructions         Signed, Thurmon Fair, MD  08/24/2023 6:34 PM    [facility]

## 2023-08-20 NOTE — Patient Instructions (Signed)
 Medication Instructions:  No changes  *If you need a refill on your cardiac medications before your next appointment, please call your pharmacy*  Testing/Procedures: ZIO XT- Long Term Monitor Instructions  Your physician has requested you wear a ZIO patch monitor for 7 days.  This is a single patch monitor. [employer] supplies one patch monitor per enrollment. Additional stickers are not available. Please do not apply patch if you will be having a Nuclear Stress Test,  Echocardiogram, Cardiac CT, MRI, or Chest Xray during the period you would be wearing the  monitor. The patch cannot be worn during these tests. You cannot remove and re-apply the  ZIO XT patch monitor.  Your ZIO patch monitor will be mailed 3 day USPS to your address on file. It may take 3-5 days  to receive your monitor after you have been enrolled.  Once you have received your monitor, please review the enclosed instructions. Your monitor  has already been registered assigning a specific monitor serial # to you.  Billing and Patient Assistance Program Information  We have supplied [employer] with any of your insurance information on file for billing purposes. [employer] offers a sliding scale Patient Assistance Program for patients that do not have  insurance, or whose insurance does not completely cover the cost of the ZIO monitor.  You must apply for the Patient Assistance Program to qualify for this discounted rate.  To apply, please call [employer] at [phone], select option 4, select option 2, ask to apply for  Patient Assistance Program. Meredeth Ide will ask your household income, and how many people  are in your household. They will quote your out-of-pocket cost based on that information.  [employer] will also be able to set up a 37-month, interest-free payment plan if needed.  Applying the monitor   Shave hair from upper left chest.  Hold abrader disc by orange tab. Rub abrader in 40 strokes over the upper left chest as   indicated in your monitor instructions.  Clean area with 4 enclosed alcohol pads. Let dry.  Apply patch as indicated in monitor instructions. Patch will be placed under collarbone on left  side of chest with arrow pointing upward.  Rub patch adhesive wings for 2 minutes. Remove white label marked "1". Remove the white  label marked "2". Rub patch adhesive wings for 2 additional minutes.  While looking in a mirror, press and release button in center of patch. A small green light will  flash 3-4 times. This will be your only indicator that the monitor has been turned on.  Do not shower for the first 24 hours. You may shower after the first 24 hours.  Press the button if you feel a symptom. You will hear a small click. Record Date, Time and  Symptom in the Patient Logbook.  When you are ready to remove the patch, follow instructions on the last 2 pages of Patient  Logbook. Stick patch monitor onto the last page of Patient Logbook.  Place Patient Logbook in the blue and white box. Use locking tab on box and tape box closed  securely. The blue and white box has prepaid postage on it. Please place it in the mailbox as  soon as possible. Your physician should have your test results approximately 7 days after the  monitor has been mailed back to [facility].  Call [facility] Customer Care at [phone] if you have questions regarding  your ZIO XT patch monitor. Call them immediately if you see an orange light  blinking on your  monitor.  If your monitor falls off in less than 4 days, contact our Monitor department at [phone].  If your monitor becomes loose or falls off after 4 days call [employer] at [phone] for  suggestions on securing your monitor    Follow-Up: At [facility], you and your health needs are our priority.  As part of our continuing mission to provide you with exceptional heart care, we have created designated Provider Care Teams.  These Care Teams  include your primary Cardiologist (physician) and Advanced Practice Providers (APPs -  Physician Assistants and Nurse Practitioners) who all work together to provide you with the care you need, when you need it.  We recommend signing up for the patient portal called "MyChart".  Sign up information is provided on this After Visit Summary.  MyChart is used to connect with patients for Virtual Visits (Telemedicine).  Patients are able to view lab/test results, encounter notes, upcoming appointments, etc.  Non-urgent messages can be sent to your provider as well.   To learn more about what you can do with MyChart, go to [email or web address].    Your next appointment:   4 month(s)  Provider:   Thurmon Fair, MD     Other Instructions

## 2023-08-20 NOTE — Progress Notes (Unsigned)
 Enrolled patient for a 7 day Zio XT monitor to be mailed to patients home.

## 2023-09-15 ENCOUNTER — Telehealth: Payer: Self-pay | Admitting: Cardiovascular Disease

## 2023-09-15 NOTE — Telephone Encounter (Signed)
 Spoke with Rosann Auerbach, aware will contact her once dr croitoru reviews the monitor.

## 2023-09-15 NOTE — Telephone Encounter (Signed)
 Daughter Rosann Auerbach) called to follow-up on the results of patient's heart monitor test.

## 2023-09-16 ENCOUNTER — Encounter: Payer: Self-pay | Admitting: Cardiovascular Disease

## 2023-09-16 DIAGNOSIS — R55 Syncope and collapse: Secondary | ICD-10-CM | POA: Diagnosis not present

## 2023-09-16 DIAGNOSIS — I959 Hypotension, unspecified: Secondary | ICD-10-CM

## 2023-09-22 NOTE — Telephone Encounter (Signed)
 Can we please order her a 24-hour blood pressure monitor for hypotension

## 2023-10-21 ENCOUNTER — Telehealth: Payer: Self-pay | Admitting: Cardiovascular Disease

## 2023-10-21 NOTE — Telephone Encounter (Signed)
 Patient is following-up on getting a heart monitor.  Patient stated can leave a message on her answering machine.

## 2023-10-21 NOTE — Telephone Encounter (Signed)
 Called patient. Someone says hello and then hangs up. Tried to call back x3 received busy signal. Will try again

## 2023-11-06 NOTE — Telephone Encounter (Signed)
 Spoke with pt, aware of monitor results and she did not have any weak spells while wearing the monitor. Will make dr c aware.

## 2023-12-26 ENCOUNTER — Ambulatory Visit: Admitting: Cardiovascular Disease

## 2024-06-14 ENCOUNTER — Encounter: Payer: Self-pay | Admitting: Physician Assistant

## 2024-07-02 ENCOUNTER — Telehealth: Payer: Self-pay | Admitting: Physician Assistant

## 2024-07-02 NOTE — Telephone Encounter (Signed)
 Sueanne called wanting to know if her mom could be placed on the wait list.

## 2024-08-17 ENCOUNTER — Ambulatory Visit: Payer: Self-pay | Admitting: Physician Assistant

## 2024-08-17 ENCOUNTER — Ambulatory Visit: Payer: Self-pay

## 2024-08-31 ENCOUNTER — Ambulatory Visit

## 2024-08-31 ENCOUNTER — Ambulatory Visit: Admitting: Physician Assistant

## 2024-10-08 ENCOUNTER — Ambulatory Visit

## 2024-10-08 ENCOUNTER — Ambulatory Visit: Admitting: Physician Assistant
